# Patient Record
Sex: Female | Born: 1970
Health system: Southern US, Community
[De-identification: ages and names within clinical notes are randomized; demographics above are authoritative.]

## PROBLEM LIST (undated history)

## (undated) HISTORY — PX: WISDOM TOOTH EXTRACTION: SHX21

---

## 2004-05-15 HISTORY — PX: DILATION AND CURETTAGE OF UTERUS: SHX78

## 2008-08-05 ENCOUNTER — Ambulatory Visit: Payer: Self-pay | Admitting: Sports Medicine

## 2008-08-05 DIAGNOSIS — M25559 Pain in unspecified hip: Secondary | ICD-10-CM

## 2008-08-05 DIAGNOSIS — M7750 Other enthesopathy of unspecified foot: Secondary | ICD-10-CM

## 2008-08-05 DIAGNOSIS — M2170 Unequal limb length (acquired), unspecified site: Secondary | ICD-10-CM

## 2008-08-05 HISTORY — DX: Unequal limb length (acquired), unspecified site: M21.70

## 2008-08-05 HISTORY — DX: Other enthesopathy of unspecified foot and ankle: M77.50

## 2008-08-05 HISTORY — DX: Pain in unspecified hip: M25.559

## 2008-09-09 ENCOUNTER — Ambulatory Visit: Payer: Self-pay | Admitting: Sports Medicine

## 2012-10-03 ENCOUNTER — Ambulatory Visit (INDEPENDENT_AMBULATORY_CARE_PROVIDER_SITE_OTHER): Payer: BC Managed Care – PPO | Admitting: Sports Medicine

## 2012-10-03 ENCOUNTER — Encounter: Payer: Self-pay | Admitting: Sports Medicine

## 2012-10-03 VITALS — BP 110/73 | HR 67 | Ht 67.0 in | Wt 120.0 lb

## 2012-10-03 DIAGNOSIS — M2170 Unequal limb length (acquired), unspecified site: Secondary | ICD-10-CM

## 2012-10-03 DIAGNOSIS — M7750 Other enthesopathy of unspecified foot: Secondary | ICD-10-CM

## 2012-10-03 DIAGNOSIS — M722 Plantar fascial fibromatosis: Secondary | ICD-10-CM

## 2012-10-03 HISTORY — DX: Plantar fascial fibromatosis: M72.2

## 2012-10-03 NOTE — Progress Notes (Signed)
  Subjective:    Patient ID: Jean Miller, female    DOB: 09/10/1970, 42 y.o.   MRN: [id number]  HPI  Pt presents to clinic for evaluation of lt heel pain since 09/14/12.  Wore high heels all day at a graduation, the next day was painful to bear weight. Pain is at the insertion of plantar fascia. Took a week off of running. When started back running, felt like pain was some better. Now also having some left hip pain.  From a visit in 2010 with no that she has a shorter right leg and metatarsal arch collapse  She continues to run 10-13 miles a week and works out in [employer]     Review of Systems     Objective:   Physical Exam  NAD  Lt foot:  Good great toe motion ttp at plantar fascia insertion Bunionette on lt Morton's foot 3rd toe equal length to great toe  Long arch preserved bilat  Post tib function good bilat Kissing calluses on lateral toes - 3-5 bilat Hip abduction strong bilat Hip flexion strong bilat  Rt leg 1 cm longer than lt leg  Running gait shows that without correction she leans to the right side Otherwise form is good      Assessment & Plan:

## 2012-10-03 NOTE — Assessment & Plan Note (Signed)
Continue using metatarsal pads  Placement of these is correct

## 2012-10-03 NOTE — Assessment & Plan Note (Signed)
Since this seems to be early in the process we will use an arch strap and icing  Stretches and home exercises  Recheck if not resolving in 6 weeks

## 2012-10-03 NOTE — Assessment & Plan Note (Signed)
She had placed the correction on the wrong leg and so I think this was triggering her hip pain  We added a 5/16 heel pad on the right and a 3/16 on the left and her gait was significantly improved

## 2012-10-22 ENCOUNTER — Ambulatory Visit: Payer: Self-pay | Admitting: Sports Medicine

## 2016-05-18 ENCOUNTER — Telehealth: Payer: Self-pay | Admitting: Internal Medicine

## 2016-05-18 NOTE — Telephone Encounter (Signed)
Patient states that she was seen at [facility] for a consultation for colonoscopy but would like to come to our office because her husband is a patient of Dr. Vena Rua. Patient states that her mother was diagnosed with colon cancer at 43 and had a maternal uncle that was diagnosed with colon cancer in his 27s. Patient is requesting to see Dr. Hilarie Fredrickson and records placed on his desk for review.

## 2016-05-23 ENCOUNTER — Encounter: Payer: Self-pay | Admitting: Internal Medicine

## 2016-05-23 NOTE — Telephone Encounter (Signed)
Records reviewed and LM on Vmail to call back to schedule colon w/Dr. Hilarie Fredrickson

## 2016-07-19 ENCOUNTER — Ambulatory Visit (AMBULATORY_SURGERY_CENTER): Payer: Self-pay

## 2016-07-19 VITALS — Ht 67.0 in | Wt 120.4 lb

## 2016-07-19 DIAGNOSIS — Z800 Family history of malignant neoplasm of digestive organs: Secondary | ICD-10-CM

## 2016-07-19 MED ORDER — NA SULFATE-K SULFATE-MG SULF 17.5-3.13-1.6 GM/177ML PO SOLN: 1.0000 | Freq: Once | ORAL | 0 refills | Status: AC

## 2016-07-19 NOTE — Progress Notes (Signed)
Denies allergies to eggs or soy products. Denies complication of anesthesia or sedation. Denies use of weight loss medication. Denies use of O2.   Emmi instructions given for colonoscopy.  

## 2016-07-25 ENCOUNTER — Encounter: Payer: Self-pay | Admitting: Internal Medicine

## 2016-08-03 ENCOUNTER — Encounter: Payer: Self-pay | Admitting: Internal Medicine

## 2016-08-03 ENCOUNTER — Ambulatory Visit (AMBULATORY_SURGERY_CENTER): Payer: BLUE CROSS/BLUE SHIELD | Admitting: Internal Medicine

## 2016-08-03 VITALS — BP 92/48 | HR 66 | Temp 98.6°F | Resp 11 | Ht 67.0 in | Wt 120.0 lb

## 2016-08-03 DIAGNOSIS — Z1212 Encounter for screening for malignant neoplasm of rectum: Secondary | ICD-10-CM | POA: Diagnosis not present

## 2016-08-03 DIAGNOSIS — K635 Polyp of colon: Secondary | ICD-10-CM | POA: Diagnosis not present

## 2016-08-03 DIAGNOSIS — Z1211 Encounter for screening for malignant neoplasm of colon: Secondary | ICD-10-CM | POA: Diagnosis not present

## 2016-08-03 DIAGNOSIS — Z800 Family history of malignant neoplasm of digestive organs: Secondary | ICD-10-CM

## 2016-08-03 DIAGNOSIS — D128 Benign neoplasm of rectum: Secondary | ICD-10-CM

## 2016-08-03 DIAGNOSIS — K621 Rectal polyp: Secondary | ICD-10-CM | POA: Diagnosis not present

## 2016-08-03 MED ORDER — SODIUM CHLORIDE 0.9 % IV SOLN: 500.0000 mL | INTRAVENOUS | Status: DC

## 2016-08-03 NOTE — Progress Notes (Signed)
A and O x3. Report to RN. Tolerated MAC anesthesia well.

## 2016-08-03 NOTE — Progress Notes (Signed)
Pt's states no medical or surgical changes since previsit or office visit. 

## 2016-08-03 NOTE — Patient Instructions (Signed)
Handout given on polyps   YOU HAD AN ENDOSCOPIC PROCEDURE TODAY: Refer to the procedure report and other information in the discharge instructions given to you for any specific questions about what was found during the examination. If this information does not answer your questions, please call [place] office at [phone] to clarify.   YOU SHOULD EXPECT: Some feelings of bloating in the abdomen. Passage of more gas than usual. Walking can help get rid of the air that was put into your GI tract during the procedure and reduce the bloating. If you had a lower endoscopy (such as a colonoscopy or flexible sigmoidoscopy) you may notice spotting of blood in your stool or on the toilet paper. Some abdominal soreness may be present for a day or two, also.  DIET: Your first meal following the procedure should be a light meal and then it is ok to progress to your normal diet. A half-sandwich or bowl of soup is an example of a good first meal. Heavy or fried foods are harder to digest and may make you feel nauseous or bloated. Drink plenty of fluids but you should avoid alcoholic beverages for 24 hours. If you had a esophageal dilation, please see attached instructions for diet.    ACTIVITY: Your care partner should take you home directly after the procedure. You should plan to take it easy, moving slowly for the rest of the day. You can resume normal activity the day after the procedure however YOU SHOULD NOT DRIVE, use power tools, machinery or perform tasks that involve climbing or major physical exertion for 24 hours (because of the sedation medicines used during the test).   SYMPTOMS TO REPORT IMMEDIATELY: A gastroenterologist can be reached at any hour. Please call [phone]  for any of the following symptoms:  Following lower endoscopy (colonoscopy, flexible sigmoidoscopy) Excessive amounts of blood in the stool  Significant tenderness, worsening of abdominal pains  Swelling of the abdomen that is  new, acute  Fever of 100 or higher  s  FOLLOW UP:  If any biopsies were taken you will be contacted by phone or by letter within the next 1-3 weeks. Call [phone]  if you have not heard about the biopsies in 3 weeks.  Please also call with any specific questions about appointments or follow up tests.

## 2016-08-03 NOTE — Progress Notes (Signed)
Called to room to assist during endoscopic procedure.  Patient ID and intended procedure confirmed with present staff. Received instructions for my participation in the procedure from the performing physician.  

## 2016-08-03 NOTE — Op Note (Signed)
[facility] Patient Name: Jean Miller Procedure Date: 08/03/2016 10:53 AM MRN: [id number] Endoscopist: Jerene Bears , MD Age: 45 Referring MD:  Date of Birth: 12/18/1970 Gender: Female Account #: [id number] Procedure:                Colonoscopy Indications:              Screening in patient at increased risk: Family                            history of 1st-degree relative with colorectal                            cancer, This is the patient's first colonoscopy Medicines:                Monitored Anesthesia Care Procedure:                Pre-Anesthesia Assessment:                           - Prior to the procedure, a History and Physical                            was performed, and patient medications and                            allergies were reviewed. The patient's tolerance of                            previous anesthesia was also reviewed. The risks                            and benefits of the procedure and the sedation                            options and risks were discussed with the patient.                            All questions were answered, and informed consent                            was obtained. Prior Anticoagulants: The patient has                            taken no previous anticoagulant or antiplatelet                            agents. ASA Grade Assessment: I - A normal, healthy                            patient. After reviewing the risks and benefits,                            the patient was deemed in satisfactory condition to  undergo the procedure.                           After obtaining informed consent, the colonoscope                            was passed under direct vision. Throughout the                            procedure, the patient's blood pressure, pulse, and                            oxygen saturations were monitored continuously. The                            Model PCF-H190DL  [id number]) scope was introduced                            through the anus and advanced to the the terminal                            ileum. The colonoscopy was performed without                            difficulty. The patient tolerated the procedure                            well. The quality of the bowel preparation was                            excellent. The terminal ileum, ileocecal valve,                            appendiceal orifice, and rectum were photographed. Scope In: 10:58:50 AM Scope Out: 11:24:38 AM Scope Withdrawal Time: 0 hours 19 minutes 31 seconds  Total Procedure Duration: 0 hours 25 minutes 48 seconds  Findings:                 The digital rectal exam was normal.                           The terminal ileum appeared normal.                           A 5 mm polypoid lesion was found in the distal                            rectum and approximating the dentate line. The                            lesion was sessile. No bleeding was present.                            Multiple biopsies were obtained with cold forceps  for histology in a targeted manner to exclude                            adenoma.                           The exam was otherwise without abnormality. Complications:            No immediate complications. Estimated Blood Loss:     Estimated blood loss was minimal. Impression:               - The examined portion of the ileum was normal.                           - Benign-appearing polypoid lesion in the distal                            rectum. Biopsied.                           - The examination was otherwise normal. Recommendation:           - Patient has a contact number available for                            emergencies. The signs and symptoms of potential                            delayed complications were discussed with the                            patient. Return to normal activities tomorrow.                             Written discharge instructions were provided to the                            patient.                           - Resume previous diet.                           - Continue present medications.                           - Await pathology results.                           - Repeat colonoscopy is recommended. The                            colonoscopy date will be determined after pathology                            results from today's exam become available for  review, but not longer than 5 years. Jerene Bears, MD 08/03/2016 11:34:29 AM This report has been signed electronically.

## 2016-08-04 ENCOUNTER — Telehealth: Payer: Self-pay

## 2016-08-04 NOTE — Telephone Encounter (Signed)
  Follow up Call-  Call back number 08/03/2016  Post procedure Call Back phone  # [phone]  Permission to leave phone message Yes  Some recent data might be hidden     Patient questions:  Do you have a fever, pain , or abdominal swelling? No. Pain Score  0 *  Have you tolerated food without any problems? Yes.    Have you been able to return to your normal activities? Yes.    Do you have any questions about your discharge instructions: Diet   No. Medications  No. Follow up visit  No.  Do you have questions or concerns about your Care? No.  Actions: * If pain score is 4 or above: No action needed, pain <4.

## 2016-08-09 ENCOUNTER — Encounter: Payer: Self-pay | Admitting: Internal Medicine

## 2016-08-29 ENCOUNTER — Encounter: Payer: Self-pay | Admitting: Family Medicine

## 2016-08-29 ENCOUNTER — Ambulatory Visit (INDEPENDENT_AMBULATORY_CARE_PROVIDER_SITE_OTHER): Payer: BLUE CROSS/BLUE SHIELD | Admitting: Family Medicine

## 2016-08-29 ENCOUNTER — Ambulatory Visit (HOSPITAL_BASED_OUTPATIENT_CLINIC_OR_DEPARTMENT_OTHER)
Admission: RE | Admit: 2016-08-29 | Discharge: 2016-08-29 | Disposition: A | Discharge status: Home / Self Care | Payer: BLUE CROSS/BLUE SHIELD | Source: Ambulatory Visit | Attending: Family Medicine | Admitting: Family Medicine

## 2016-08-29 VITALS — BP 116/75 | HR 83 | Ht 67.0 in | Wt 120.0 lb

## 2016-08-29 DIAGNOSIS — M25552 Pain in left hip: Secondary | ICD-10-CM | POA: Insufficient documentation

## 2016-08-29 NOTE — Patient Instructions (Signed)
Your x-rays were reassuring. You have SI joint dysfunction and piriformis syndrome. Try to avoid painful activities when possible. Tennis ball to massage area when sitting Pick 2-3 stretches where you feel the pull in the area of pain - do 3 of these and hold for 20-30 seconds twice a day. Hip standing rotation and side raises 3 sets of 10 once a day. Given length that you have had this I would start physical therapy as well. Tylenol or aleve only if needed for pain. Would consider advanced imaging (MRI) if not improving as expected. Follow up with me in 1 month to 6 weeks.

## 2016-08-31 DIAGNOSIS — M25552 Pain in left hip: Secondary | ICD-10-CM | POA: Insufficient documentation

## 2016-08-31 HISTORY — DX: Pain in left hip: M25.552

## 2016-08-31 NOTE — Progress Notes (Signed)
PCP: Oliver Pila, MD  Subjective:   HPI: Patient is a 46 y.o. female here for low back pain.  Patient reports she's had left sided low back pain for about 3 days. She usually works out 3 times a week and runs 3 days a week. Has had back issues dating back to November but especially worse past 3 days. With prolonged standing pain worsens left hip. Pain currently 5/10, up to 7/10 and sharp at worst. No acute injury or trauma. Couldn't get comfortable by this Sunday. No radiation down leg. No numbness or tingling. Has been alternating ice and heat, elevating legs, using KT Tape. No skin changes.  No past medical history on file.  Current Outpatient Prescriptions on File Prior to Visit  Medication Sig Dispense Refill  . KRILL OIL PO Take 1 capsule by mouth daily.     . magnesium citrate solution Take 6 mLs by mouth once.    . Omega-3 Fatty Acids (FISH OIL) 1000 MG CAPS Take 1 capsule by mouth daily.    . Probiotic Product (PROBIOTIC-10 PO) Take 2 capsules by mouth daily.    . TURMERIC CURCUMIN PO Take 60 capsules by mouth daily.     Current Facility-Administered Medications on File Prior to Visit  Medication Dose Route Frequency Provider Last Rate Last Dose  . 0.9 %  sodium chloride infusion  500 mL Intravenous Continuous Jerene Bears, MD        Past Surgical History:  Procedure Laterality Date  . DILATION AND CURETTAGE OF UTERUS  2006  . WISDOM TOOTH EXTRACTION Bilateral     Allergies  Allergen Reactions  . Sulfa Antibiotics Nausea Only  . Penicillins Rash    Social History   Social History  . Marital status: Married    Spouse name: N/A  . Number of children: N/A  . Years of education: N/A   Occupational History  . Not on file.   Social History Main Topics  . Smoking status: Never Smoker  . Smokeless tobacco: Never Used  . Alcohol use Yes     Comment: wine 2 or 3 a week.  . Drug use: No  . Sexual activity: Not on file   Other Topics Concern  . Not  on file   Social History Narrative  . No narrative on file    Family History  Problem Relation Age of Onset  . Colon polyps Mother   . Colon polyps Maternal Uncle   . Esophageal cancer Maternal Grandmother   . Rectal cancer Neg Hx   . Stomach cancer Neg Hx     BP 116/75   Pulse 83   Ht 5\' 7"  (1.702 m)   Wt 120 lb (54.4 kg)   LMP 08/16/2016   BMI 18.79 kg/m   Review of Systems: See HPI above.     Objective:  Physical Exam:  Gen: NAD, comfortable in exam room  Back/left hip: No gross deformity, scoliosis. TTP left SI joint and over piriformis.  No midline or bony TTP. FROM without pain on back motions or passive hip motion. Strength LEs 5/5 all muscle groups without reproducing pain. 2+ MSRs in patellar and achilles tendons, equal bilaterally. Negative SLRs. Sensation intact to light touch bilaterally. Negative logroll bilateral hips Pain with piriformis stretch, fabers. Left leg longer than right.   Assessment & Plan:  1. Left hip pain - independently reviewed radiographs and no abnormalities.  She has not been running much past 6 months due to pain - unlikely she  has a stress fracture though discussed this as a less likely possibility.  Exam fits with piriformis syndrome and SI joint dysfunction.  Start with physical therapy, home exercises and stretches.  Tylenol or aleve.  Consider MRI if not improving.  F/u in 1 month to 6 weeks.

## 2016-08-31 NOTE — Assessment & Plan Note (Signed)
independently reviewed radiographs and no abnormalities.  She has not been running much past 6 months due to pain - unlikely she has a stress fracture though discussed this as a less likely possibility.  Exam fits with piriformis syndrome and SI joint dysfunction.  Start with physical therapy, home exercises and stretches.  Tylenol or aleve.  Consider MRI if not improving.  F/u in 1 month to 6 weeks.

## 2017-05-21 ENCOUNTER — Ambulatory Visit: Payer: BLUE CROSS/BLUE SHIELD | Admitting: Sports Medicine

## 2017-05-21 ENCOUNTER — Encounter: Payer: Self-pay | Admitting: Sports Medicine

## 2017-05-21 ENCOUNTER — Ambulatory Visit
Admission: RE | Admit: 2017-05-21 | Discharge: 2017-05-21 | Disposition: A | Discharge status: Home / Self Care | Payer: BLUE CROSS/BLUE SHIELD | Source: Ambulatory Visit | Attending: Sports Medicine | Admitting: Sports Medicine

## 2017-05-21 VITALS — BP 112/72 | Ht 67.0 in | Wt 120.0 lb

## 2017-05-21 DIAGNOSIS — M545 Low back pain: Secondary | ICD-10-CM | POA: Diagnosis not present

## 2017-05-21 DIAGNOSIS — G8929 Other chronic pain: Secondary | ICD-10-CM | POA: Diagnosis not present

## 2017-05-21 NOTE — Progress Notes (Signed)
   Subjective:    Patient ID: Jean Miller, female    DOB: [birth year], 47 y.o.   MRN: [id number]  HPI chief complaint: Low back pain and left hip pain  Very pleasant 47 year old female comes in today complaining of left-sided low back pain that has been present since November 2017. She has noticed that certain activities such as bending forward will cause discomfort that she localizes in the left buttock. She's had several episodes of spasm with radiating pain down the left leg, specifically down the left hamstring to the knee. When she first began to experience symptoms she was a runner. She has since stopped running and now does more recreational walking which does seem to help with her pain. She remains active in the gym working out. She does notice that things like squats and deadlifts will cause discomfort. She does endorse some occasional groin pain as well but the groin pain is not severe. She does get occasional numbness and tingling. Some mild weakness. Some pain at night. She was seen and evaluated by Dr. Barbaraann Barthel in April of last year. X-rays of her left hip were unremarkable. She was referred to physical therapy but only attended a single visit. No prior low back surgery. No fevers or chills.  Past medical history reviewed Medications reviewed Allergies reviewed    Review of Systems    as above Objective:   Physical Exam  Well-developed, fit appearing. No acute distress. Awake alert and oriented 3. Vital signs reviewed  Lumbar spine: Patient has good lumbar range of motion but does have pain with forward flexion. No spasm. She is tender to palpation around the left SI joint and piriformis. Negative straight leg raise bilaterally. She does have some slight weakness with resisted great toe extension on the left compared to the right but the remainder of her strength is 5/5 in both lower extremities. Reflexes are brisk and equal at the Achilles and patellar tendons  bilaterally. Sensation is intact to light touch grossly. No atrophy.  Left hip: Smooth painless hip range of motion with a negative log roll. No tenderness to palpation.  Neutral gait with walking.      Assessment & Plan:   Chronic left-sided low back pain likely secondary to bulging lumbar disc  I'm going to start with getting an x-ray of her lumbar spine. I will call her with those results when available. We will discuss further workup and treatment at that time to include possible MRI versus a return to physical therapy. In the meantime, she needs to continue to avoid exercises in the gym that will exacerbate her symptoms such as squats and deadlifts.

## 2017-05-22 ENCOUNTER — Other Ambulatory Visit: Payer: Self-pay | Admitting: *Deleted

## 2017-05-22 ENCOUNTER — Telehealth: Payer: Self-pay | Admitting: Sports Medicine

## 2017-05-22 DIAGNOSIS — M5126 Other intervertebral disc displacement, lumbar region: Secondary | ICD-10-CM

## 2017-05-22 DIAGNOSIS — M5136 Other intervertebral disc degeneration, lumbar region: Secondary | ICD-10-CM

## 2017-05-22 NOTE — Telephone Encounter (Signed)
  I spoke with Jean Miller on the phone today after reviewing the lumbar spine x-ray that she had done yesterday. Overall her spine is rather healthy. She does have some mild disc space narrowing at L5-S1. I think her symptoms are discogenic. She has decided to try physical therapy prior to getting an MRI. I think that is certainly reasonable. We will arrange for physical therapy in [facility] since that is most convenient for her. If she does not improve then we may revisit the idea of a lumbar spine MRI. She will follow-up with me as needed.

## 2019-04-01 ENCOUNTER — Telehealth: Payer: Self-pay

## 2019-04-01 NOTE — Telephone Encounter (Signed)
The patient's husband (a patient of Dr. Burt Knack) called to request a new patient appointment. Informed him Dr. Burt Knack is not accepting new patients at this time.   Spoke with patient and his wife on speakerphone. They are very proactive and "progressive" with their care and would like to have a doctor who is up to date with imaging and reads studies.  Scheduled the patient for NP visit with Dr. Meda Coffee 12/15.  Will request echo from 5 years ago to have on hand for visit. They were grateful for assistance.

## 2019-04-17 ENCOUNTER — Telehealth: Payer: Self-pay

## 2019-04-17 NOTE — Telephone Encounter (Signed)
I CALLED THE PATIENT TO GET MORE INFORMATION ON HER ECHO DONE 5 YEARS AGO. DR. Adella Nissen OFFICE STATES THAT THE PATIENT HAS NOT BEEN SEEN AT THEIR OFFICE SINCE 2017 AND THAT THEY HAD NO RECORD OF AN ECHO. I ALSO CALLED DR HIMES AND THEY STATED THAT SHE WAS NOT A PATIENT OF THEIRS.

## 2019-04-29 ENCOUNTER — Ambulatory Visit: Payer: PRIVATE HEALTH INSURANCE | Admitting: Cardiology

## 2019-04-29 ENCOUNTER — Other Ambulatory Visit: Payer: Self-pay

## 2019-04-29 ENCOUNTER — Encounter: Payer: Self-pay | Admitting: Cardiology

## 2019-04-29 VITALS — BP 122/78 | HR 85 | Ht 67.0 in | Wt 122.0 lb

## 2019-04-29 DIAGNOSIS — E785 Hyperlipidemia, unspecified: Secondary | ICD-10-CM | POA: Diagnosis not present

## 2019-04-29 DIAGNOSIS — Z8249 Family history of ischemic heart disease and other diseases of the circulatory system: Secondary | ICD-10-CM

## 2019-04-29 DIAGNOSIS — R011 Cardiac murmur, unspecified: Secondary | ICD-10-CM | POA: Diagnosis not present

## 2019-04-29 NOTE — Progress Notes (Addendum)
Cardiology Office Note:    Date:  05/01/2019   ID:  Jean Miller, DOB 30-May-1970, MRN [id number]  PCP:  Oliver Pila, MD  Cardiologist:  No primary care provider on file.  Electrophysiologist:  None   Referring MD: Oliver Pila, MD   Reason for visit: Hyperlipidemia, murmur  History of Present Illness:    Jean Miller is a 48 y.o. female with a hx of hyperlipidemia, family history of early CAD and history of a murmur.  Patient previously had an echocardiogram performed and showed mild mitral and tricuspid regurgitation otherwise normal LVEF.  She was previously diagnosed with hyperlipidemia with elevated LDL and LDL particle numbers, this was not treated with statins but she is taking fish oil.  She is otherwise very healthy, she exercises on a regular basis she is a runner runs 3 to 5 miles several times a week with no symptoms of chest pain or shortness of breath.  She denies any palpitations lower extremity edema.  Past Medical History:  Diagnosis Date  . HIP PAIN, RIGHT 08/05/2008   Qualifier: Diagnosis of  By: Oneida Alar MD, KARL    . Left hip pain 08/31/2016  . METATARSALGIA 08/05/2008   Qualifier: Diagnosis of  By: Oneida Alar MD, KARL    . Plantar fasciitis of left foot 10/03/2012   This seems to been triggered by wearing high heels that were uncomfortable   . UNEQUAL LEG LENGTH 08/05/2008   Qualifier: Diagnosis of  By: Oneida Alar MD, KARL      Past Surgical History:  Procedure Laterality Date  . DILATION AND CURETTAGE OF UTERUS  2006  . WISDOM TOOTH EXTRACTION Bilateral     Current Medications: Current Meds  Medication Sig  . Cholecalciferol (VITAMIN D) 50 MCG (2000 UT) tablet Take 2,000 Units by mouth daily.  Marland Kitchen DHA-PHOSPHOLIPIDS, PORCINE, PO Take 1 capsule by mouth daily.  Marland Kitchen ELDERBERRY PO Take 1 capsule by mouth 2 (two) times daily.  . magnesium citrate solution Take 6 mLs by mouth once.  . Menaquinone-7 (VITAMIN K2) 100 MCG CAPS Take 1 capsule by  mouth daily.  . Omega-3 Fatty Acids (FISH OIL) 1000 MG CAPS Take 1 capsule by mouth daily.  . Probiotic Product (PROBIOTIC-10 PO) Take 2 capsules by mouth daily.  . TURMERIC CURCUMIN PO Take 60 capsules by mouth daily.  . Zinc 15 MG CAPS Take 1 capsule by mouth 2 (two) times daily.   Current Facility-Administered Medications for the 04/29/19 encounter (Office Visit) with Dorothy Spark, MD  Medication  . 0.9 %  sodium chloride infusion     Allergies:   Sulfa antibiotics and Penicillins   Social History   Socioeconomic History  . Marital status: Married    Spouse name: Not on file  . Number of children: Not on file  . Years of education: Not on file  . Highest education level: Not on file  Occupational History  . Not on file  Tobacco Use  . Smoking status: Never Smoker  . Smokeless tobacco: Never Used  Substance and Sexual Activity  . Alcohol use: Yes    Comment: wine 2 or 3 a week.  . Drug use: No  . Sexual activity: Not on file  Other Topics Concern  . Not on file  Social History Narrative  . Not on file   Social Determinants of Health   Financial Resource Strain:   . Difficulty of Paying Living Expenses: Not on file  Food Insecurity:   . Worried About [employer] of  Food in the Last Year: Not on file  . Ran Out of Food in the Last Year: Not on file  Transportation Needs:   . Lack of Transportation (Medical): Not on file  . Lack of Transportation (Non-Medical): Not on file  Physical Activity:   . Days of Exercise per Week: Not on file  . Minutes of Exercise per Session: Not on file  Stress:   . Feeling of Stress : Not on file  Social Connections:   . Frequency of Communication with Friends and Family: Not on file  . Frequency of Social Gatherings with Friends and Family: Not on file  . Attends Religious Services: Not on file  . Active Member of Clubs or Organizations: Not on file  . Attends Archivist Meetings: Not on file  . Marital Status: Not  on file     Family History: The patient's family history includes Colon polyps in her maternal uncle and mother; Esophageal cancer in her maternal grandmother. There is no history of Rectal cancer or Stomach cancer.  ROS:   Please see the history of present illness.    All other systems reviewed and are negative.  EKGs/Labs/Other Studies Reviewed:    The following studies were reviewed today:  EKG:  EKG is ordered today.  The ekg ordered today demonstrates normal sinus rhythm, normal EKG, no prior available for comparison.  This was personally reviewed.  Recent Labs: 04/29/2019: ALT 8; BUN 16; Creatinine, Ser 0.65; Hemoglobin 14.2; Platelets 236; Potassium 4.1; Sodium 139; TSH 1.310  Recent Lipid Panel No results found for: CHOL, TRIG, HDL, CHOLHDL, VLDL, LDLCALC, LDLDIRECT  Physical Exam:    VS:  BP 122/78   Pulse 85   Ht 5' 7"  (1.702 m)   Wt 122 lb (55.3 kg)   SpO2 98%   BMI 19.11 kg/m     Wt Readings from Last 3 Encounters:  04/29/19 122 lb (55.3 kg)  05/21/17 120 lb (54.4 kg)  08/29/16 120 lb (54.4 kg)     GEN:  Well nourished, well developed in no acute distress HEENT: Normal NECK: No JVD; No carotid bruits LYMPHATICS: No lymphadenopathy CARDIAC: RRR, 2/6 systolic murmurs, rubs, gallops RESPIRATORY:  Clear to auscultation without rales, wheezing or rhonchi  ABDOMEN: Soft, non-tender, non-distended MUSCULOSKELETAL:  No edema; No deformity  SKIN: Warm and dry NEUROLOGIC:  Alert and oriented x 3 PSYCHIATRIC:  Normal affect   ASSESSMENT:    1. Murmur, cardiac   2. Family history of early CAD   3. Hyperlipidemia, unspecified hyperlipidemia type    PLAN:    In order of problems listed above:  1. Murmur, previously having mild mitral and tricuspid regurgitation, there was a note about calcifications possibly mitral annular calcifications, we will repeat an echocardiogram to reevaluate. 2. Hyperlipidemia -patient has elevated LDL particle number, today 1379,  HDL 83, LDL 125 and triglycerides of 80, her CRP is 0, LP-IR score is less than 2540 is normal.  She has normal liver function test.  She is awaiting calcium scoring, we will decide about lipid management based on calcium score result that might  reclassify her risk for based on Framingham risk score assessment.   Medication Adjustments/Labs and Tests Ordered: Current medicines are reviewed at length with the patient today.  Concerns regarding medicines are outlined above.  Orders Placed This Encounter  Procedures  . CT CARDIAC SCORING  . NMR, lipoprofile  . CBC with Differential  . Comp Met (CMET)  . TSH  . C-reactive  protein  . EKG 12-Lead  . ECHOCARDIOGRAM COMPLETE   No orders of the defined types were placed in this encounter.   Patient Instructions  Medication Instructions:   *If you need a refill on your cardiac medications before your next appointment, please call your pharmacy*  Lab Work: Your physician recommends that you have lab work today- NMR lipid, CMET, CRP, TSH, CBC.  If you have labs (blood work) drawn today and your tests are completely normal, you will receive your results only by: Marland Kitchen MyChart Message (if you have MyChart) OR . A paper copy in the mail If you have any lab test that is abnormal or we need to change your treatment, we will call you to review the results.  Testing/Procedures: Your physician has requested that you have an echocardiogram. Echocardiography is a painless test that uses sound waves to create images of your heart. It provides your doctor with information about the size and shape of your heart and how well your heart's chambers and valves are working. This procedure takes approximately one hour. There are no restrictions for this procedure.  Cardiac CT scanning for calcium score, (CAT scanning), is a noninvasive, special x-ray that produces cross-sectional images of the body using x-rays and a computer. CT scans help physicians diagnose and  treat medical conditions. For some CT exams, a contrast material is used to enhance visibility in the area of the body being studied. CT scans provide greater clarity and reveal more details than regular x-ray exams.  Follow-Up: At [facility], you and your health needs are our priority.  As part of our continuing mission to provide you with exceptional heart care, we have created designated Provider Care Teams.  These Care Teams include your primary Cardiologist (physician) and Advanced Practice Providers (APPs -  Physician Assistants and Nurse Practitioners) who all work together to provide you with the care you need, when you need it.  Your next appointment:   3 month(s)  The format for your next appointment:   In Person  Provider:   You may see Dr. Meda Coffee or one of the following Advanced Practice Providers on your designated Care Team:    Melina Copa, PA-C  Ermalinda Barrios, PA-C      Signed, Ena Dawley, MD  05/01/2019 12:10 PM    [place]

## 2019-04-29 NOTE — Patient Instructions (Addendum)
Medication Instructions:   *If you need a refill on your cardiac medications before your next appointment, please call your pharmacy*  Lab Work: Your physician recommends that you have lab work today- NMR lipid, CMET, CRP, TSH, CBC.  If you have labs (blood work) drawn today and your tests are completely normal, you will receive your results only by: Marland Kitchen MyChart Message (if you have MyChart) OR . A paper copy in the mail If you have any lab test that is abnormal or we need to change your treatment, we will call you to review the results.  Testing/Procedures: Your physician has requested that you have an echocardiogram. Echocardiography is a painless test that uses sound waves to create images of your heart. It provides your doctor with information about the size and shape of your heart and how well your heart's chambers and valves are working. This procedure takes approximately one hour. There are no restrictions for this procedure.  Cardiac CT scanning for calcium score, (CAT scanning), is a noninvasive, special x-ray that produces cross-sectional images of the body using x-rays and a computer. CT scans help physicians diagnose and treat medical conditions. For some CT exams, a contrast material is used to enhance visibility in the area of the body being studied. CT scans provide greater clarity and reveal more details than regular x-ray exams.  Follow-Up: At [facility], you and your health needs are our priority.  As part of our continuing mission to provide you with exceptional heart care, we have created designated Provider Care Teams.  These Care Teams include your primary Cardiologist (physician) and Advanced Practice Providers (APPs -  Physician Assistants and Nurse Practitioners) who all work together to provide you with the care you need, when you need it.  Your next appointment:   3 month(s)  The format for your next appointment:   In Person  Provider:   You may see Dr. Meda Coffee  or one of the following Advanced Practice Providers on your designated Care Team:    Melina Copa, PA-C  Ermalinda Barrios, PA-C

## 2019-04-30 LAB — COMPREHENSIVE METABOLIC PANEL
ALT: 8 IU/L (ref 0–32)
AST: 13 IU/L (ref 0–40)
Albumin/Globulin Ratio: 2 (ref 1.2–2.2)
Albumin: 4.8 g/dL (ref 3.8–4.8)
Alkaline Phosphatase: 67 IU/L (ref 39–117)
BUN/Creatinine Ratio: 25 — ABNORMAL HIGH (ref 9–23)
BUN: 16 mg/dL (ref 6–24)
Bilirubin Total: 0.4 mg/dL (ref 0.0–1.2)
CO2: 25 mmol/L (ref 20–29)
Calcium: 9.3 mg/dL (ref 8.7–10.2)
Chloride: 99 mmol/L (ref 96–106)
Creatinine, Ser: 0.65 mg/dL (ref 0.57–1.00)
GFR calc Af Amer: 122 mL/min/{1.73_m2} (ref >59)
GFR calc non Af Amer: 106 mL/min/{1.73_m2} (ref >59)
Globulin, Total: 2.4 g/dL (ref 1.5–4.5)
Glucose: 104 mg/dL — ABNORMAL HIGH (ref 65–99)
Potassium: 4.1 mmol/L (ref 3.5–5.2)
Sodium: 139 mmol/L (ref 134–144)
Total Protein: 7.2 g/dL (ref 6.0–8.5)

## 2019-04-30 LAB — NMR, LIPOPROFILE
Cholesterol, Total: 222 mg/dL — ABNORMAL HIGH (ref 100–199)
HDL Particle Number: 32.7 umol/L (ref >=30.5)
HDL-C: 83 mg/dL (ref >39)
LDL Particle Number: 1339 nmol/L — ABNORMAL HIGH (ref <1000)
LDL Size: 21.3 nm (ref >20.5)
LDL-C (NIH Calc): 125 mg/dL — ABNORMAL HIGH (ref 0–99)
LP-IR Score: <25 (ref <=45)
Small LDL Particle Number: <90 nmol/L (ref <=527)
Triglycerides: 80 mg/dL (ref 0–149)

## 2019-04-30 LAB — CBC WITH DIFFERENTIAL/PLATELET
Basophils Absolute: 0.1 10*3/uL (ref 0.0–0.2)
Basos: 1 %
EOS (ABSOLUTE): 0.1 10*3/uL (ref 0.0–0.4)
Eos: 1 %
Hematocrit: 42.8 % (ref 34.0–46.6)
Hemoglobin: 14.2 g/dL (ref 11.1–15.9)
Immature Grans (Abs): 0 10*3/uL (ref 0.0–0.1)
Immature Granulocytes: 0 %
Lymphocytes Absolute: 1.4 10*3/uL (ref 0.7–3.1)
Lymphs: 22 %
MCH: 30.9 pg (ref 26.6–33.0)
MCHC: 33.2 g/dL (ref 31.5–35.7)
MCV: 93 fL (ref 79–97)
Monocytes Absolute: 0.4 10*3/uL (ref 0.1–0.9)
Monocytes: 6 %
Neutrophils Absolute: 4.6 10*3/uL (ref 1.4–7.0)
Neutrophils: 70 %
Platelets: 236 10*3/uL (ref 150–450)
RBC: 4.59 x10E6/uL (ref 3.77–5.28)
RDW: 11.6 % — ABNORMAL LOW (ref 11.7–15.4)
WBC: 6.5 10*3/uL (ref 3.4–10.8)

## 2019-04-30 LAB — TSH: TSH: 1.31 u[IU]/mL (ref 0.450–4.500)

## 2019-04-30 LAB — C-REACTIVE PROTEIN: CRP: 0 mg/L (ref 0–10)

## 2019-05-02 ENCOUNTER — Telehealth: Payer: Self-pay | Admitting: *Deleted

## 2019-05-02 DIAGNOSIS — R600 Localized edema: Secondary | ICD-10-CM

## 2019-05-02 DIAGNOSIS — R011 Cardiac murmur, unspecified: Secondary | ICD-10-CM

## 2019-05-02 DIAGNOSIS — E7800 Pure hypercholesterolemia, unspecified: Secondary | ICD-10-CM

## 2019-05-02 DIAGNOSIS — Z8249 Family history of ischemic heart disease and other diseases of the circulatory system: Secondary | ICD-10-CM

## 2019-05-02 DIAGNOSIS — E785 Hyperlipidemia, unspecified: Secondary | ICD-10-CM

## 2019-05-02 DIAGNOSIS — Z0000 Encounter for general adult medical examination without abnormal findings: Secondary | ICD-10-CM

## 2019-05-02 DIAGNOSIS — I739 Peripheral vascular disease, unspecified: Secondary | ICD-10-CM

## 2019-05-02 NOTE — Telephone Encounter (Signed)
Pt's husband called into the office regarding questions for himself and then added his wife has questions since seeing Dr Meda Coffee.  They are concerned because the lab work she had 12/15 was not fasting and are requesting a fasting lipid panel as well as a Apolipoprotein B be drawn while she is here for her Calcium score 12/29.  Husband also states pt had discussed with Dr Meda Coffee having a "vascuscan" at the [facility] office but this was not scheduled.      Advised I will forward this information to Dr Meda Coffee for her review and any orders.

## 2019-05-03 NOTE — Telephone Encounter (Signed)
Please repeat NMR lipids, also LPa and apolipoprotein B on 12/29. Can you also please order a B/L LE Duplex (Dg claudications)  Please ask her husband when he wants to be seen, I will be adding some clinic days in January and February, we can fit him in then.  Thank you,  K

## 2019-05-05 ENCOUNTER — Telehealth: Payer: Self-pay | Admitting: Cardiology

## 2019-05-05 NOTE — Telephone Encounter (Signed)
Pt and her Husband were both calling back to question why Dr. Meda Coffee wanted to order the lower extremity arterial US, for the pt did not want that done, she wanted the same test Dr. Burt Knack ordered on her Husband, with a 3 part vascular scan to be done.  The pts states her Husband had a Vascuscreen done back in 2018, ordered by Dr. Burt Knack.  Pt would like that same screen done.   Pts Husband is established with Dr. Burt Knack and he had a 3 part vascular scan done in 2018 that consisted of carotids, ABI's, and AAA.  Husband states this was more of a screening work-up for him, for he had abnormal cholesterol and calcium score.  Husband states the pts wants the same vascular screening done for her, as she had.   Pt and Husband do not want to proceed with lower extremity arterial US, they would like for Dr. Meda Coffee to order the requested scans as mentioned above.  Pt also states she doesn't understand why she was diagnosed with claudications as her diagnosis for ordered arterial US, for she states she never complained about her legs hurting, and she replies "I think your office is confusing me with someone else's chart."  Informed the pt and Husband that I will cancel recommended lower extremity arterial US, being they are refusing this test at this time, and forward their request for Dr. Meda Coffee to order the 3 part vascuscreen done, which included AAA, ABI'S, and Carotids. Informed both parties that I will follow-up with them once further recommendations received by Dr. Meda Coffee.  Both verbalized understanding and agrees with this plan.

## 2019-05-05 NOTE — Telephone Encounter (Signed)
Spoke with the pt and informed her that Dr. Meda Coffee recommends that we can order for her to get a repeat NMR Lipids, and add on Lipoprotein A and apolipoprotein B, for her to come in and do before or after her echo scheduled for 12/29.  Also informed the pt that Dr. Meda Coffee agreed to order for her to have bilateral lower extremity arterial US done, to further evaluate her claudications. Scheduled the pt to come in for NMR, Lipo A, and lipoprotein B to be done on 12/29.  Advised her to come fasting to that lab appt. Pt states she will do the lab prior to her scheduled echo that morning.  Informed the pt that I will place the order for her lower extremity arterial US in the system, and send a message to our [facility] schedulers to call her back and arrange this appt. Pt verbalized understanding and agrees with this plan.  Off note:  The pts Husband was not seeking out an appt with Dr. Meda Coffee, he is an established Dr. Burt Knack pt, and called in with complaints about his own personal cardiac issues to be delivered to Dr. Burt Knack and his RN, as well as called in about his wife's issues, to be addressed by Dr. Meda Coffee and myself, during the time this call was placed.

## 2019-05-05 NOTE — Telephone Encounter (Signed)
Patient is calling wanting to speak with a nurse in regards to the upcoming test that needs to be scheduled. Please advise.

## 2019-05-12 NOTE — Telephone Encounter (Signed)
Called the pt and Husband back, and informed them that the order for Vascuscreen has been placed, and our Scheduling dept will be in contact with both parties to arrange this test. While endorsing this to the pt and wife, they both were requesting that Dr. Meda Coffee remove the repeat NMR W lipids and lipoprotein A that the pt is to have done tomorrow, and just order for her to have a regular lipid panel and Apolipoprotein B done, due to deductible cost.  Per Dr. Meda Coffee, she okayed for the pt to have a regular lipid panel and Apolipoprotein B done for tomorrow 12/29, when the pt comes in for her echo and Calcium Score.  Pt is aware to come fasting for this lab appt.  Both verbalized understanding and agrees with this plan. Both were gracious for all the assistance provided.

## 2019-05-12 NOTE — Telephone Encounter (Signed)
Please order Vascuscreen: it contains bilateral carotid ultrasound, bilateral ABIs and screening for AAA, thank you

## 2019-05-12 NOTE — Telephone Encounter (Signed)
RE: pt needs to be scheduled for vascuscreen per Dr. Meda Coffee Received: Today Message Contents  Jerlyn Ly, LPN   Cc: Baxter Flattery  Forward to [place] to schedule. / I was unable to reach patient.  I will be off the rest of the week. Theodora Blow

## 2019-05-12 NOTE — Telephone Encounter (Signed)
RE: pt needs to be scheduled for vascuscreen per Dr. Meda Coffee Received: Today Message Contents  Jerlyn Ly, LPN   Cc: Baxter Flattery  Forward to [place] to schedule. / I was unable to reach patient.  I will be off the rest of the week. /

## 2019-05-13 ENCOUNTER — Other Ambulatory Visit: Payer: Self-pay

## 2019-05-13 ENCOUNTER — Other Ambulatory Visit: Payer: PRIVATE HEALTH INSURANCE | Admitting: *Deleted

## 2019-05-13 ENCOUNTER — Ambulatory Visit (HOSPITAL_COMMUNITY): Payer: PRIVATE HEALTH INSURANCE | Attending: Cardiovascular Disease

## 2019-05-13 ENCOUNTER — Ambulatory Visit (INDEPENDENT_AMBULATORY_CARE_PROVIDER_SITE_OTHER)
Admission: RE | Admit: 2019-05-13 | Discharge: 2019-05-13 | Disposition: A | Discharge status: Home / Self Care | Payer: Self-pay | Source: Ambulatory Visit | Attending: Cardiology | Admitting: Cardiology

## 2019-05-13 DIAGNOSIS — I739 Peripheral vascular disease, unspecified: Secondary | ICD-10-CM

## 2019-05-13 DIAGNOSIS — E785 Hyperlipidemia, unspecified: Secondary | ICD-10-CM

## 2019-05-13 DIAGNOSIS — R011 Cardiac murmur, unspecified: Secondary | ICD-10-CM | POA: Diagnosis present

## 2019-05-13 DIAGNOSIS — Z0000 Encounter for general adult medical examination without abnormal findings: Secondary | ICD-10-CM

## 2019-05-13 DIAGNOSIS — Z8249 Family history of ischemic heart disease and other diseases of the circulatory system: Secondary | ICD-10-CM

## 2019-05-13 DIAGNOSIS — E7800 Pure hypercholesterolemia, unspecified: Secondary | ICD-10-CM

## 2019-05-14 LAB — LIPID PANEL
Chol/HDL Ratio: 2.8 ratio (ref 0.0–4.4)
Cholesterol, Total: 204 mg/dL — ABNORMAL HIGH (ref 100–199)
HDL: 74 mg/dL (ref >39)
LDL Chol Calc (NIH): 118 mg/dL — ABNORMAL HIGH (ref 0–99)
Triglycerides: 66 mg/dL (ref 0–149)
VLDL Cholesterol Cal: 12 mg/dL (ref 5–40)

## 2019-05-14 LAB — APOLIPOPROTEIN B: Apolipoprotein B: 101 mg/dL — ABNORMAL HIGH (ref <90)

## 2019-05-14 NOTE — Telephone Encounter (Signed)
Pts Vascuscreen is scheduled for 05/20/19 at 0900. Pt made aware of appt date and time by [facility] scheduler.

## 2019-05-19 ENCOUNTER — Telehealth: Payer: Self-pay | Admitting: *Deleted

## 2019-05-19 NOTE — Telephone Encounter (Signed)
We can move her appointment to 1 year from now.

## 2019-05-19 NOTE — Telephone Encounter (Signed)
I spoke with pt and reviewed recommendations from 12/29 lab results with her. She saw Dr Meda Coffee in December with 3 month follow up planned.  Patient reports she is feeling good and is asking if she still needs to be seen in March or could appointment be moved to a later date.

## 2019-05-20 ENCOUNTER — Encounter: Payer: Self-pay | Admitting: *Deleted

## 2019-05-20 ENCOUNTER — Ambulatory Visit (HOSPITAL_COMMUNITY)
Admission: RE | Admit: 2019-05-20 | Discharge: 2019-05-20 | Disposition: A | Discharge status: Home / Self Care | Payer: PRIVATE HEALTH INSURANCE | Source: Ambulatory Visit | Attending: Cardiology | Admitting: Cardiology

## 2019-05-20 ENCOUNTER — Other Ambulatory Visit: Payer: Self-pay

## 2019-05-20 DIAGNOSIS — Z0000 Encounter for general adult medical examination without abnormal findings: Secondary | ICD-10-CM

## 2019-05-20 DIAGNOSIS — R011 Cardiac murmur, unspecified: Secondary | ICD-10-CM

## 2019-05-20 DIAGNOSIS — Z8249 Family history of ischemic heart disease and other diseases of the circulatory system: Secondary | ICD-10-CM

## 2019-05-20 DIAGNOSIS — E7800 Pure hypercholesterolemia, unspecified: Secondary | ICD-10-CM

## 2019-05-20 DIAGNOSIS — E785 Hyperlipidemia, unspecified: Secondary | ICD-10-CM

## 2019-05-20 NOTE — Telephone Encounter (Signed)
Sent the pt a mychart message on her active mychart, in regards to Dr. Meda Coffee saying it is ok for her to follow-up in one year, vs in 3 months, being she feels good and all test have come back normal so far.  Informed the pt on her mychart message that we will send her a letter in 10 months prompting her to call the office to arrange her 1 year follow-up appt with Dr. Meda Coffee.  Advised the pt to call the office back or mychart back with any questions or concerns regarding message left.  One year follow-up recall was placed in Epic.

## 2019-05-20 NOTE — Telephone Encounter (Signed)
Pt read her mychart message regarding ok for one year follow-up with Dr. Meda Coffee, via her mychart account.

## 2019-08-19 ENCOUNTER — Ambulatory Visit: Payer: PRIVATE HEALTH INSURANCE | Admitting: Cardiology

## 2020-03-03 ENCOUNTER — Other Ambulatory Visit: Payer: Self-pay

## 2020-03-03 ENCOUNTER — Emergency Department
Admission: EM | Admit: 2020-03-03 | Discharge: 2020-03-03 | Disposition: A | Discharge status: Home / Self Care | Payer: PRIVATE HEALTH INSURANCE | Source: Home / Self Care

## 2020-03-03 DIAGNOSIS — R42 Dizziness and giddiness: Secondary | ICD-10-CM | POA: Diagnosis not present

## 2020-03-03 DIAGNOSIS — R110 Nausea: Secondary | ICD-10-CM

## 2020-03-03 LAB — POCT URINALYSIS DIP (MANUAL ENTRY)
Bilirubin, UA: NEGATIVE
Glucose, UA: NEGATIVE mg/dL
Ketones, POC UA: NEGATIVE mg/dL
Leukocytes, UA: NEGATIVE
Nitrite, UA: NEGATIVE
Protein Ur, POC: NEGATIVE mg/dL
Spec Grav, UA: 1.02 (ref 1.010–1.025)
Urobilinogen, UA: 0.2 E.U./dL
pH, UA: 8.5 — AB (ref 5.0–8.0)

## 2020-03-03 LAB — POCT CBC W AUTO DIFF (K'VILLE URGENT CARE)

## 2020-03-03 LAB — POCT FASTING CBG KUC MANUAL ENTRY: POCT Glucose (KUC): 118 mg/dL — AB (ref 70–99)

## 2020-03-03 MED ORDER — MECLIZINE HCL 25 MG PO TABS: 25.0000 mg | ORAL_TABLET | Freq: Three times a day (TID) | ORAL | 0 refills | Status: DC | PRN

## 2020-03-03 MED ORDER — ONDANSETRON 4 MG PO TBDP: 4.0000 mg | ORAL_TABLET | Freq: Three times a day (TID) | ORAL | 0 refills | Status: DC | PRN

## 2020-03-03 MED ORDER — ONDANSETRON 4 MG PO TBDP
4.0000 mg | ORAL_TABLET | Freq: Once | ORAL | Status: AC
  Administered 2020-03-03: 4 mg via ORAL

## 2020-03-03 NOTE — Discharge Instructions (Signed)
Treating your symptoms today. All vitals signs normal, blood work was reassuring and did not explain symptoms. Recommend rest for remainder of the day. If dizziness reoccurs or any other symptoms appear that are severe, go immediately to the emergency department . I have prescribed Meclizine for dizziness and Zofran for nausea if symptoms reoccur.

## 2020-03-03 NOTE — ED Triage Notes (Signed)
Patient presents to Urgent Care with complaints of dizziness since earlier this morning, followed by nausea. Patient reports this happened in the past when unisom made her BP drop. Has been having diarrhea as well, states the dizziness has gotten slightly better but the nausea and vomiting have worsened. Pt is not diabetic, no family history.

## 2020-03-03 NOTE — ED Provider Notes (Addendum)
[facility]    CSN: [id number] Arrival date & time: 03/03/20  1311      History   Chief Complaint Chief Complaint  Patient presents with  . Dizziness    HPI Jean Miller is a 49 y.o. female.   HPI  Patient presents with a complaint of dizziness x today. No history of diabetes, heart disease, inner ear disease, or cardiac disease. Endorses associated nausea. Symptoms wax and wain. No history of vertigo. BP stable. Drinks plenty of water. No HA or visual changes. Unaware of any precipitating events that may have caused dizziness. Denies chest pain, shortness of breath, or weakness. Past Medical History:  Diagnosis Date  . HIP PAIN, RIGHT 08/05/2008   Qualifier: Diagnosis of  By: Oneida Alar MD, KARL    . Left hip pain 08/31/2016  . METATARSALGIA 08/05/2008   Qualifier: Diagnosis of  By: Oneida Alar MD, KARL    . Plantar fasciitis of left foot 10/03/2012   This seems to been triggered by wearing high heels that were uncomfortable   . UNEQUAL LEG LENGTH 08/05/2008   Qualifier: Diagnosis of  By: Oneida Alar MD, KARL      Patient Active Problem List   Diagnosis Date Noted  . Left hip pain 08/31/2016  . Plantar fasciitis of left foot 10/03/2012  . HIP PAIN, RIGHT 08/05/2008  . METATARSALGIA 08/05/2008  . UNEQUAL LEG LENGTH 08/05/2008    Past Surgical History:  Procedure Laterality Date  . DILATION AND CURETTAGE OF UTERUS  2006  . WISDOM TOOTH EXTRACTION Bilateral     OB History   No obstetric history on file.      Home Medications    Prior to Admission medications   Medication Sig Start Date End Date Taking? Authorizing Provider  Probiotic Product (PROBIOTIC-10 PO) Take 2 capsules by mouth daily.   Yes [provider]  Cholecalciferol (VITAMIN D) 50 MCG (2000 UT) tablet Take 2,000 Units by mouth daily.    [provider]  DHA-PHOSPHOLIPIDS, PORCINE, PO Take 1 capsule by mouth daily.    [provider]  ELDERBERRY PO Take 1 capsule  by mouth 2 (two) times daily.    [provider]  magnesium citrate solution Take 6 mLs by mouth once.    [provider]  Menaquinone-7 (VITAMIN K2) 100 MCG CAPS Take 1 capsule by mouth daily.    [provider]  Omega-3 Fatty Acids (FISH OIL) 1000 MG CAPS Take 1 capsule by mouth daily.    [provider]  TURMERIC CURCUMIN PO Take 60 capsules by mouth daily.    [provider]  Zinc 15 MG CAPS Take 1 capsule by mouth 2 (two) times daily.    [provider]    Family History Family History  Problem Relation Age of Onset  . Colon polyps Mother   . Colon polyps Maternal Uncle   . Esophageal cancer Maternal Grandmother   . Heart failure Father   . Rectal cancer Neg Hx   . Stomach cancer Neg Hx     Social History Social History   Tobacco Use  . Smoking status: Never Smoker  . Smokeless tobacco: Never Used  Substance Use Topics  . Alcohol use: Yes    Alcohol/week: 1.0 standard drink    Types: 1 Standard drinks or equivalent per week    Comment: wine 2 or 3 a week.  . Drug use: No     Allergies   Sulfa antibiotics and Penicillins Review of Systems Review  of Systems Pertinent negatives listed in HPI  Physical Exam Triage Vital Signs ED Triage Vitals  Enc Vitals Group     BP 03/03/20 1329 126/86     Pulse Rate 03/03/20 1329 74     Resp 03/03/20 1329 16     Temp 03/03/20 1329 97.9 F (36.6 C)     Temp Source 03/03/20 1329 Oral     SpO2 03/03/20 1329 99 %     Weight --      Height --      Head Circumference --      Peak Flow --      Pain Score 03/03/20 1326 0     Pain Loc --      Pain Edu? --      Excl. in Arial? --    No data found.  Updated Vital Signs BP 126/86 (BP Location: Right Arm)   Pulse 74   Temp 97.9 F (36.6 C) (Oral)   Resp 16   SpO2 99%   Visual Acuity Right Eye Distance:   Left Eye Distance:   Bilateral Distance:    Right Eye Near:   Left Eye Near:    Bilateral Near:     Physical  Exam Constitutional: Patient appears well-developed and well-nourished. No distress. HENT: Normocephalic, atraumatic, External right and left ear normal. Oropharynx is clear and moist.  Eyes: Conjunctivae and EOM are normal. PERRLA, no scleral icterus. Neck: Normal ROM. Neck supple. No JVD. No tracheal deviation. No thyromegaly. CVS: RRR, S1/S2 +, no murmurs, no gallops, no carotid bruit.  Pulmonary: Effort and breath sounds normal, no stridor, rhonchi, wheezes, rales.  Abdominal: Soft. BS +, no distension, tenderness, rebound or guarding.  Neuro: Alert. Normal reflexes, muscle tone coordination. No cranial nerve deficit. Skin: Skin is warm and dry. No rash noted. Not diaphoretic. No erythema. No pallor. Psychiatric: Normal mood and affect. Behavior, judgment, thought content normal.  UC Treatments / Results  Labs (all labs ordered are listed, but only abnormal results are displayed) Labs Reviewed  POCT FASTING CBG [facility] MANUAL ENTRY - Abnormal; Notable for the following components:      Result Value   POCT Glucose ([facility]) 118 (*)    All other components within normal limits  POCT URINALYSIS DIP (MANUAL ENTRY) - Abnormal; Notable for the following components:   Blood, UA trace-intact (*)    pH, UA 8.5 (*)    All other components within normal limits  POCT CBC W AUTO DIFF ([facility])    EKG   Radiology No results found.  Procedures Procedures (including critical care time)  Medications Ordered in UC Medications  ondansetron (ZOFRAN-ODT) disintegrating tablet 4 mg (4 mg Oral Given 03/03/20 1334)    Initial Impression / Assessment and Plan / UC Course  I have reviewed the triage vital signs and the nursing notes.  Pertinent labs & imaging results that were available during my care of the patient were reviewed by me and considered in my medical decision making (see chart for details).     Glucose, UA, CBC no acute abnormalities noted that would explain symptoms. Will  trial Meclizine and Zofran for symptoms of dizziness and nausea. Strict ER precautions given. Patient verbalized understanding and agreement with plan. Final Clinical Impressions(s) / UC Diagnoses   Final diagnoses:  Dizziness  Nausea without vomiting     Discharge Instructions     Treating your symptoms today. All vitals signs normal, blood work was reassuring and did not explain symptoms.  Recommend rest for remainder of the day. If dizziness reoccurs or any other symptoms appear that are severe, go immediately to the emergency department . I have prescribed Meclizine for dizziness and Zofran for nausea if symptoms reoccur.      ED Prescriptions    Medication Sig Dispense Auth. Provider   meclizine (ANTIVERT) 25 MG tablet  (Status: Discontinued) Take 1 tablet (25 mg total) by mouth 3 (three) times daily as needed for dizziness. 30 tablet Scot Jun, FNP   ondansetron (ZOFRAN ODT) 4 MG disintegrating tablet  (Status: Discontinued) Take 1 tablet (4 mg total) by mouth every 8 (eight) hours as needed for nausea or vomiting. 20 tablet Scot Jun, FNP   meclizine (ANTIVERT) 25 MG tablet Take 1 tablet (25 mg total) by mouth 3 (three) times daily as needed for dizziness. 30 tablet Scot Jun, FNP   ondansetron (ZOFRAN ODT) 4 MG disintegrating tablet Take 1 tablet (4 mg total) by mouth every 8 (eight) hours as needed for nausea or vomiting. 20 tablet Scot Jun, FNP     PDMP not reviewed this encounter.   Scot Jun, FNP 03/07/20 1207    Scot Jun, FNP 03/07/20 [phone]

## 2020-10-15 IMAGING — CT CT HEART SCORING
2 series · 16 of 20 positions shown, 18 images · non-contrast
Comparison: None.
COMPARISON: None.

Addendum:
EXAM:
OVER-READ INTERPRETATION  CT CHEST

The following report is an over-read performed by radiologist Dr.
Bouzakri Haouat [REDACTED] on 05/13/2019. This
over-read does not include interpretation of cardiac or coronary
anatomy or pathology. The coronary calcium score interpretation by
the cardiologist is attached.
CLINICAL DATA: Risk stratification
Coronary Calcium Score
TECHNIQUE: The patient was scanned on a Siemens Force scanner. Axial
non-contrast 3 mm slices were carried out through the heart. The
data set was analyzed on a dedicated work station and scored using
the Agatson method.

[Series 2: casc 3.0 i36f 2 bestdiast 68 % · axial · 0.30mm/px · z∈[-228,-108]mm · 8 of 52 slices shown, 10 images]
[im 6/52  vessel]
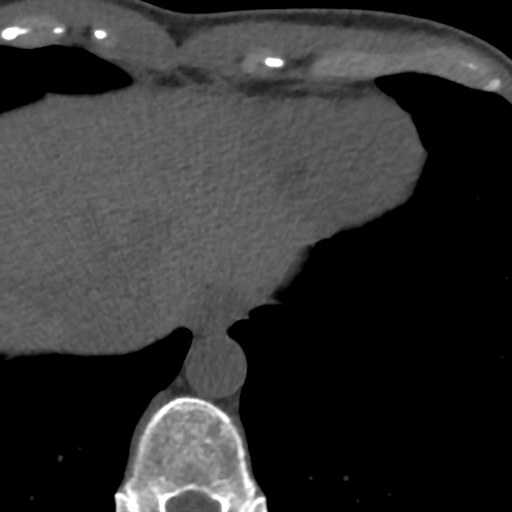
[im 6/52  lung]
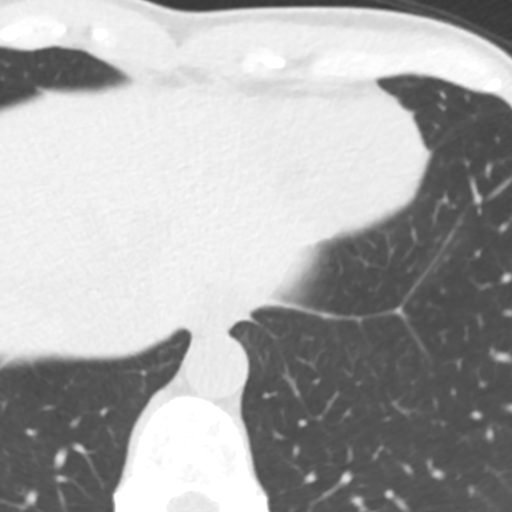
[im 12/52  vessel]
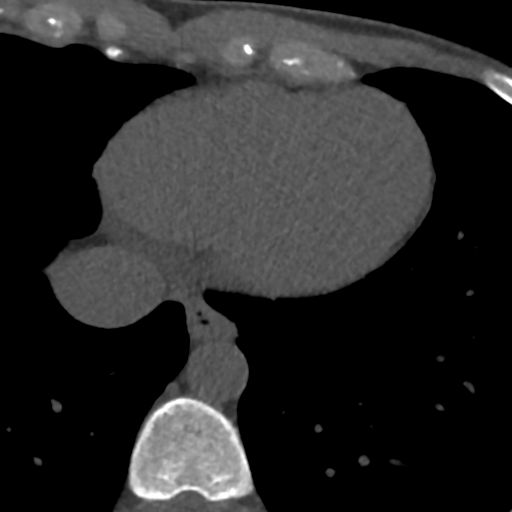
[im 18/52  vessel]
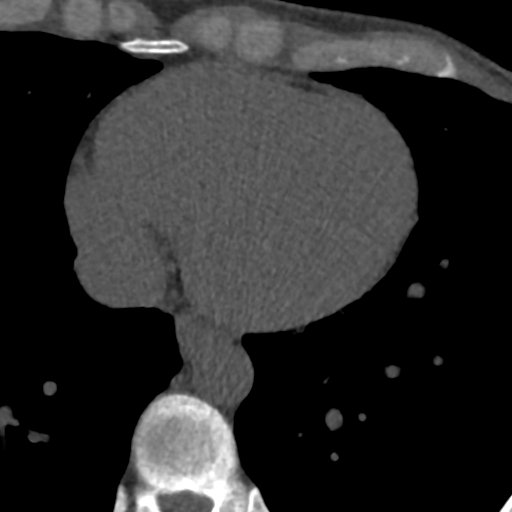
[im 23/52  vessel]
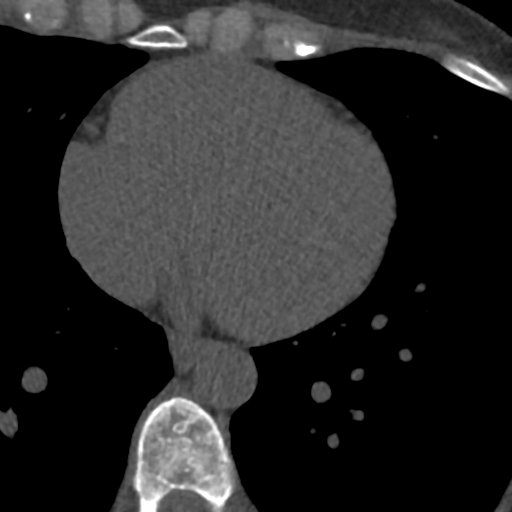
[im 29/52  vessel]
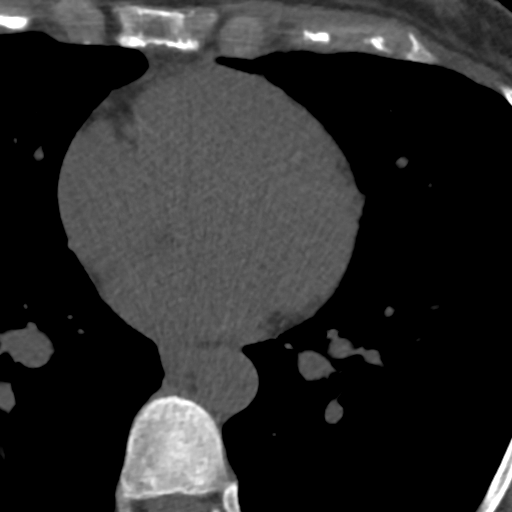
[im 29/52  lung]
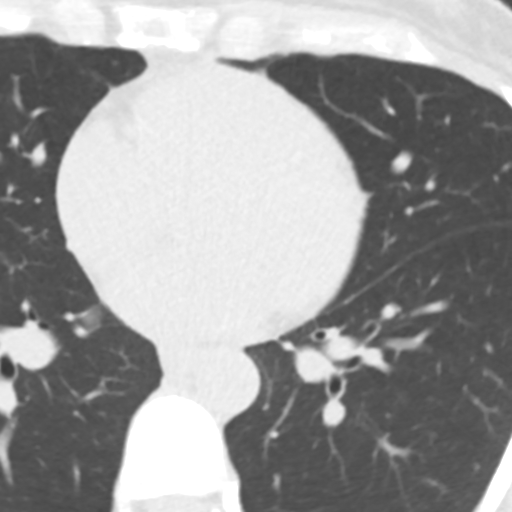
[im 35/52  vessel]
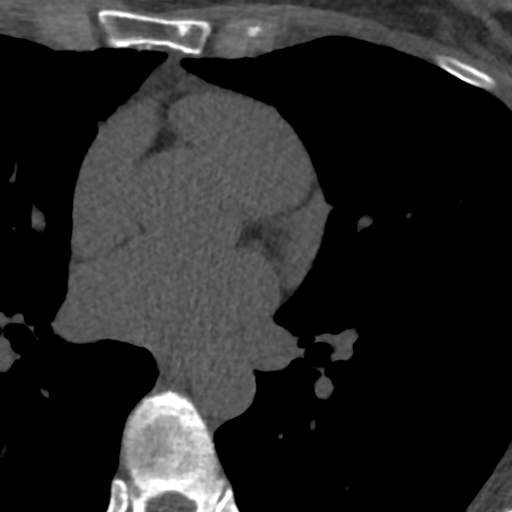
[im 40/52  vessel]
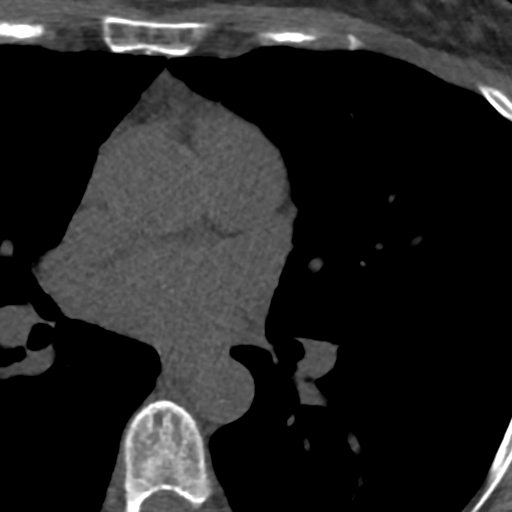
[im 46/52  vessel]
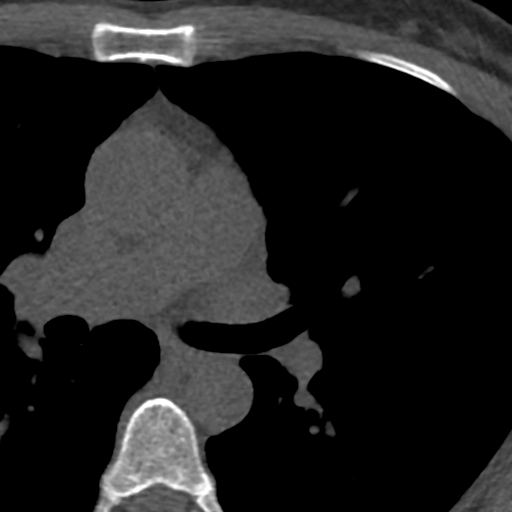

[Series 4: lung st 68 % · axial · 0.66mm/px · z∈[-228,-108]mm · 8 of 52 slices shown]
[im 6/52  lung]
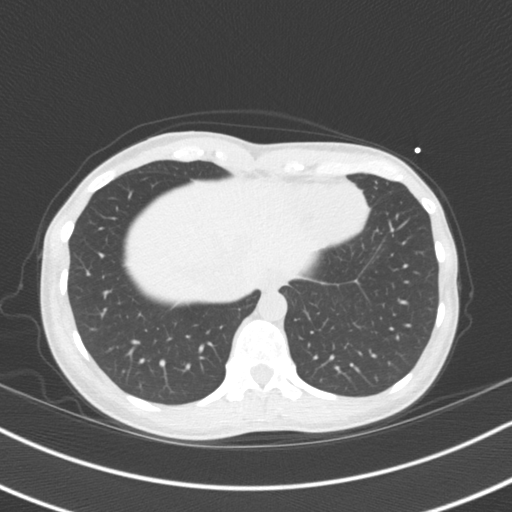
[im 12/52  lung]
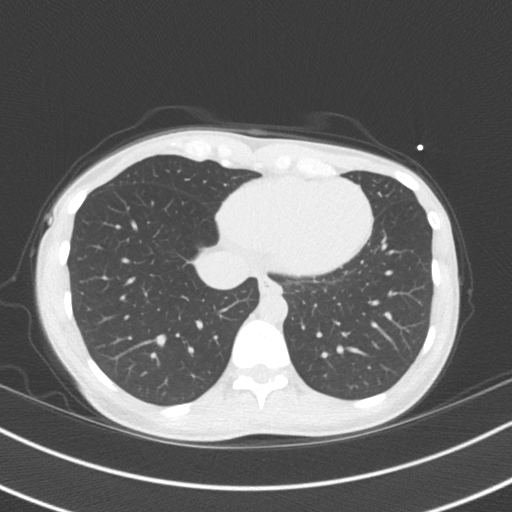
[im 18/52  lung]
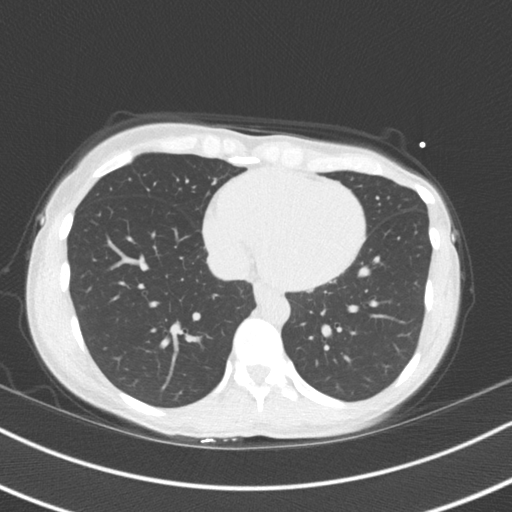
[im 23/52  lung]
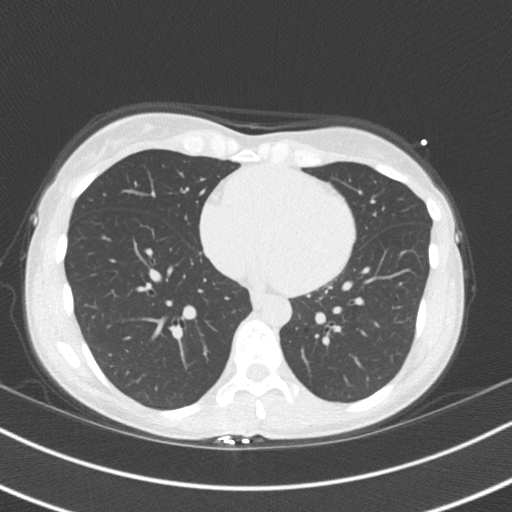
[im 29/52  lung]
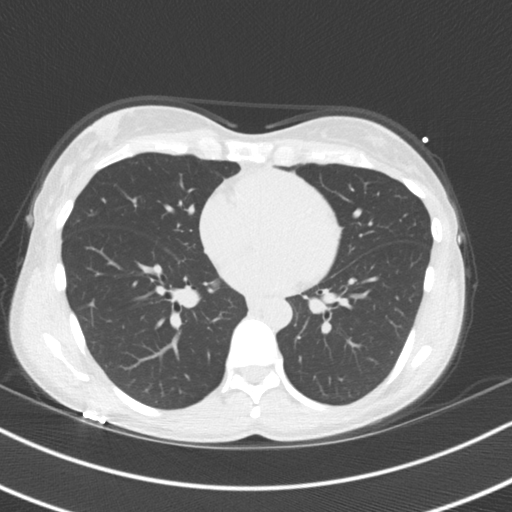
[im 35/52  lung]
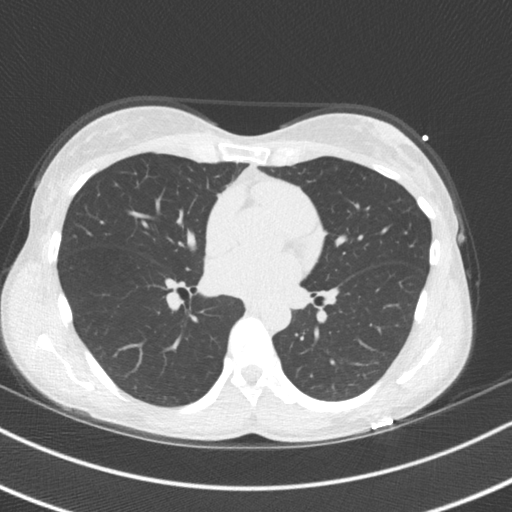
[im 40/52  lung]
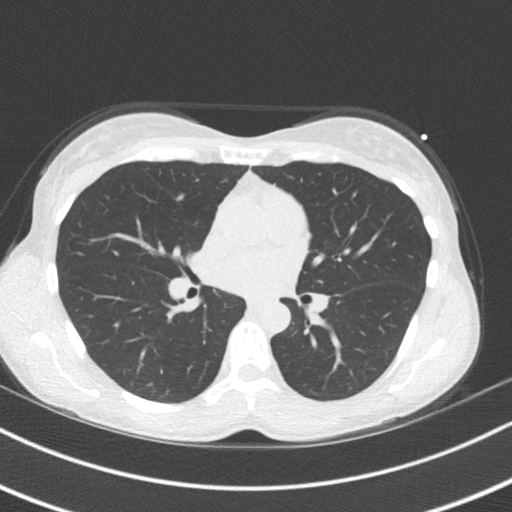
[im 46/52  lung]
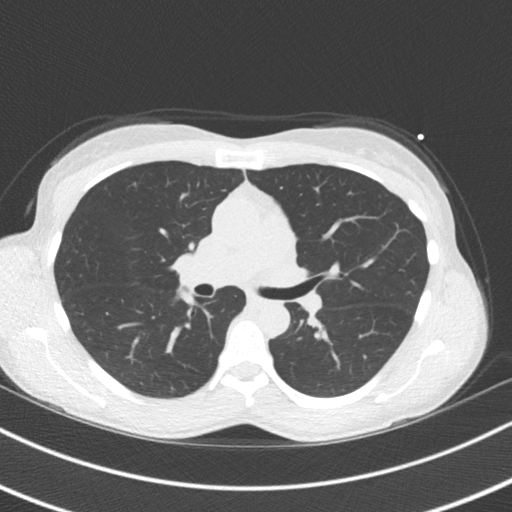

[16 of 20 positions shown; findings below may reference images not displayed]

FINDINGS: Within the visualized portions of the thorax there are no suspicious
appearing pulmonary nodules or masses, there is no acute
consolidative airspace disease, no pleural effusions, no
pneumothorax and no lymphadenopathy. Visualized portions of the
upper abdomen are unremarkable. There are no aggressive appearing
lytic or blastic lesions noted in the visualized portions of the
skeleton.
IMPRESSION: 1. No significant incidental noncardiac findings are noted.
FINDINGS: Non-cardiac: See separate report from [REDACTED].

Ascending Aorta: Normal size, no calcifications.

Pericardium: Normal.

Coronary arteries: Normal origin.
IMPRESSION: Coronary calcium score of 0. This was 0 percentile for age and sex
matched control.

*** End of Addendum ***
EXAM:
OVER-READ INTERPRETATION  CT CHEST

The following report is an over-read performed by radiologist Dr.
Bouzakri Haouat [REDACTED] on 05/13/2019. This
over-read does not include interpretation of cardiac or coronary
anatomy or pathology. The coronary calcium score interpretation by
the cardiologist is attached.
FINDINGS: Within the visualized portions of the thorax there are no suspicious
appearing pulmonary nodules or masses, there is no acute
consolidative airspace disease, no pleural effusions, no
pneumothorax and no lymphadenopathy. Visualized portions of the
upper abdomen are unremarkable. There are no aggressive appearing
lytic or blastic lesions noted in the visualized portions of the
skeleton.
IMPRESSION: 1. No significant incidental noncardiac findings are noted.

## 2021-08-09 ENCOUNTER — Encounter: Payer: Self-pay | Admitting: Internal Medicine

## 2021-08-23 ENCOUNTER — Encounter: Payer: Self-pay | Admitting: Internal Medicine

## 2021-08-24 ENCOUNTER — Encounter: Payer: Self-pay | Admitting: Gastroenterology

## 2021-08-24 ENCOUNTER — Ambulatory Visit: Payer: No Typology Code available for payment source | Admitting: Gastroenterology

## 2021-08-24 VITALS — BP 96/70 | HR 76 | Ht 65.25 in | Wt 123.5 lb

## 2021-08-24 DIAGNOSIS — Z86010 Personal history of colonic polyps: Secondary | ICD-10-CM | POA: Diagnosis not present

## 2021-08-24 DIAGNOSIS — Z800 Family history of malignant neoplasm of digestive organs: Secondary | ICD-10-CM | POA: Insufficient documentation

## 2021-08-24 MED ORDER — NA SULFATE-K SULFATE-MG SULF 17.5-3.13-1.6 GM/177ML PO SOLN: 1.0000 | Freq: Once | ORAL | 0 refills | Status: AC

## 2021-08-24 NOTE — Patient Instructions (Signed)
You have been scheduled for an endoscopy and colonoscopy. Please follow the written instructions given to you at your visit today. ?Please pick up your prep supplies at the pharmacy within the next 1-3 days. ?If you use inhalers (even only as needed), please bring them with you on the day of your procedure. ? ?If you are age 50 or younger, your body mass index should be between 19-25. Your Body mass index is 20.39 kg/m?Marland Kitchen If this is out of the aformentioned range listed, please consider follow up with your Primary Care Provider.  ?________________________________________________________ ? ?The [facility] providers would like to encourage you to use [facility] to communicate with providers for non-urgent requests or questions.  Due to long hold times on the telephone, sending your provider a message by [facility] may be a faster and more efficient way to get a response.  Please allow 48 business hours for a response.  Please remember that this is for non-urgent requests.  ?_______________________________________________________ ? ?

## 2021-08-24 NOTE — Progress Notes (Signed)
? ? ? ?08/24/2021 ?Jean Miller ?[id number] ?February 11, 1971 ? ? ?HISTORY OF PRESENT ILLNESS: This is a 52 year old female who is a patient of Dr. Vena Rua.  She has a family history of colon cancer in her mother.  Her last colonoscopy was March 2018 at which time she had a hyperplastic polyp removed.  She is here to schedule her surveillance colonoscopy, but also wanted to discuss having an EGD.  She has never had an EGD in the past.  She reports a family history of esophageal cancer in her maternal grandmother.  She has no upper GI symptoms or complaints.  Moves her bowels regularly, no rectal bleeding. ? ? ?Past Medical History:  ?Diagnosis Date  ? HIP PAIN, RIGHT 08/05/2008  ? Qualifier: Diagnosis of  By: Oneida Alar MD, KARL    ? Left hip pain 08/31/2016  ? METATARSALGIA 08/05/2008  ? Qualifier: Diagnosis of  By: Oneida Alar MD, KARL    ? Plantar fasciitis of left foot 10/03/2012  ? This seems to been triggered by wearing high heels that were uncomfortable   ? UNEQUAL LEG LENGTH 08/05/2008  ? Qualifier: Diagnosis of  By: Oneida Alar MD, KARL    ? ?Past Surgical History:  ?Procedure Laterality Date  ? DILATION AND CURETTAGE OF UTERUS  2006  ? WISDOM TOOTH EXTRACTION Bilateral   ? ? reports that she has never smoked. She has never used smokeless tobacco. She reports current alcohol use of about 1.0 standard drink per week. She reports that she does not use drugs. ?family history includes Colon polyps in her maternal uncle and mother; Esophageal cancer in her maternal grandmother; Heart failure in her father. ?Allergies  ?Allergen Reactions  ? Sulfa Antibiotics Nausea Only  ? Penicillins Rash  ? ? ?  ?Outpatient Encounter Medications as of 08/24/2021  ?Medication Sig  ? AMBULATORY NON FORMULARY MEDICATION [employer] Thercurmin HP ?Take 1 tablet by mouth daily  ? Barberry-Oreg Grape-Goldenseal (BERBERINE COMPLEX PO) Take 500 mg by mouth 2 (two) times daily.  ? Cholecalciferol (VITAMIN D3 PO) Take 1,400 Units by mouth  daily.  ? COLLAGEN PO Take 1 Dose by mouth daily.  ? GLYCINE PO Take 2 g by mouth daily.  ? MAGNESIUM CHLORIDE PO Take 459 mg by mouth daily. With calcium 238 mg  ? magnesium citrate solution Take 6 mLs by mouth once.  ? MAGNESIUM PO Take 1 tablet by mouth daily. Magnesium L-Theronate 144 mg  ? Menaquinone-7 (VITAMIN K2 PO) Take 1 tablet by mouth daily. MK-4 200 mcg  ? Menaquinone-7 (VITAMIN K2) 100 MCG CAPS Take 1 capsule by mouth daily.  ? Omega-3 Fatty Acids (FISH OIL) 1000 MG CAPS Take 1 capsule by mouth daily.  ? Probiotic Product (PROBIOTIC-10 PO) Take 2 capsules by mouth as needed.  ? TURMERIC CURCUMIN PO Take 60 capsules by mouth daily.  ? Zinc 15 MG CAPS Take 1 capsule by mouth daily. cooper  ? [DISCONTINUED] Cholecalciferol (VITAMIN D) 50 MCG (2000 UT) tablet Take 2,000 Units by mouth daily.  ? [DISCONTINUED] DHA-PHOSPHOLIPIDS, PORCINE, PO Take 1 capsule by mouth daily.  ? [DISCONTINUED] ELDERBERRY PO Take 1 capsule by mouth 2 (two) times daily.  ? [DISCONTINUED] meclizine (ANTIVERT) 25 MG tablet Take 1 tablet (25 mg total) by mouth 3 (three) times daily as needed for dizziness.  ? [DISCONTINUED] ondansetron (ZOFRAN ODT) 4 MG disintegrating tablet Take 1 tablet (4 mg total) by mouth every 8 (eight) hours as needed for nausea or vomiting.  ? ?Facility-Administered Encounter Medications as of 08/24/2021  ?  Medication  ? 0.9 %  sodium chloride infusion  ? ? ? ?REVIEW OF SYSTEMS  : All other systems reviewed and negative except where noted in the History of Present Illness. ? ? ?PHYSICAL EXAM: ?Ht 5' 5.25" (1.657 m) Comment: height measured without shoes  Wt 123 lb 8 oz (56 kg)   BMI 20.39 kg/m?  ?General: Well developed white female in no acute distress ?Head: Normocephalic and atraumatic ?Eyes:  Sclerae anicteric, conjunctiva pink. ?Ears: Normal auditory acuity ?Lungs: Clear throughout to auscultation; no W/R/R. ?Heart: Regular rate and rhythm; no M/R/G. ?Abdomen: Soft, non-distended.  BS present.   Non-tender. ?Rectal: Will be done at the time of colonoscopy. ?Musculoskeletal: Symmetrical with no gross deformities  ?Skin: No lesions on visible extremities ?Extremities: No edema  ?Neurological: Alert oriented x 4, grossly non-focal ?Psychological:  Alert and cooperative. Normal mood and affect ? ?ASSESSMENT AND PLAN: ?*Family history of colon cancer in her mother: Last colonoscopy 07/2016 at which time she had a hyperplastic polyp removed.  We will schedule for her colonoscopy with Dr. Hilarie Fredrickson. ?*Family history of esophageal cancer in her maternal grandmother: Patient has no upper GI symptoms.  She is interested in one-time EGD at the time of her colonoscopy.  We discussed this and agreed to schedule since she is having colonoscopy as well, but established that going further if all looks well then would be no need for routine screening/surveillance. ? ?**The risks, benefits, and alternatives to EGD and colonoscopy were discussed with the patient and she consents to proceed.  ? ?CC:  Oliver Pila, MD ? ?  ?

## 2021-09-02 NOTE — Progress Notes (Signed)
Addendum: Reviewed and agree with assessment and management plan. Reno Clasby M, MD  

## 2021-09-22 ENCOUNTER — Encounter: Payer: Self-pay | Admitting: Internal Medicine

## 2021-09-25 ENCOUNTER — Encounter: Payer: Self-pay | Admitting: Certified Registered Nurse Anesthetist

## 2021-09-28 ENCOUNTER — Encounter: Payer: Self-pay | Admitting: Internal Medicine

## 2021-09-28 ENCOUNTER — Ambulatory Visit (AMBULATORY_SURGERY_CENTER): Payer: No Typology Code available for payment source | Admitting: Internal Medicine

## 2021-09-28 VITALS — BP 118/64 | HR 61 | Temp 99.1°F | Resp 12 | Ht 65.0 in | Wt 123.0 lb

## 2021-09-28 DIAGNOSIS — Z86010 Personal history of colonic polyps: Secondary | ICD-10-CM | POA: Diagnosis not present

## 2021-09-28 DIAGNOSIS — Z800 Family history of malignant neoplasm of digestive organs: Secondary | ICD-10-CM | POA: Diagnosis present

## 2021-09-28 MED ORDER — SODIUM CHLORIDE 0.9 % IV SOLN: 500.0000 mL | INTRAVENOUS | Status: DC

## 2021-09-28 NOTE — Progress Notes (Signed)
Pt's states no medical or surgical changes since previsit or office visit. 

## 2021-09-28 NOTE — Progress Notes (Signed)
? ?GASTROENTEROLOGY PROCEDURE H&P NOTE  ? ?Primary Care Physician: ?Oliver Pila, MD ? ? ? ?Reason for Procedure:  Family history of esophageal and colon cancer ? ?Plan:    EGD and colonoscopy ? ?Patient is appropriate for endoscopic procedure(s) in the ambulatory ([place]) setting. ? ?The nature of the procedure, as well as the risks, benefits, and alternatives were carefully and thoroughly reviewed with the patient. Ample time for discussion and questions allowed. The patient understood, was satisfied, and agreed to proceed.  ? ? ? ?HPI: ?Jean Miller is a 51 y.o. female who presents for EGD and colonoscopy.  Medical history as below.  Tolerated the prep.  No recent chest pain or shortness of breath.  No abdominal pain today. ? ?Past Medical History:  ?Diagnosis Date  ? HIP PAIN, RIGHT 08/05/2008  ? Qualifier: Diagnosis of  By: Oneida Alar MD, KARL    ? Left hip pain 08/31/2016  ? METATARSALGIA 08/05/2008  ? Qualifier: Diagnosis of  By: Oneida Alar MD, KARL    ? Plantar fasciitis of left foot 10/03/2012  ? This seems to been triggered by wearing high heels that were uncomfortable   ? UNEQUAL LEG LENGTH 08/05/2008  ? Qualifier: Diagnosis of  By: Oneida Alar MD, KARL    ? ? ?Past Surgical History:  ?Procedure Laterality Date  ? DILATION AND CURETTAGE OF UTERUS  2006  ? WISDOM TOOTH EXTRACTION Bilateral   ? ? ?Prior to Admission medications   ?Medication Sig Start Date End Date Taking? Authorizing Provider  ?AMBULATORY NON FORMULARY MEDICATION [employer] Thercurmin HP ?Take 1 tablet by mouth daily   Yes [provider]  ?Cholecalciferol (VITAMIN D3 PO) Take 1,400 Units by mouth daily.   Yes [provider]  ?COLLAGEN PO Take 1 Dose by mouth daily.   Yes [provider]  ?GLYCINE PO Take 2 g by mouth daily.   Yes [provider]  ?MAGNESIUM CHLORIDE PO Take 459 mg by mouth daily. With calcium 238 mg   Yes [provider]  ?magnesium citrate solution Take 6 mLs by  mouth once.   Yes [provider]  ?MAGNESIUM PO Take 1 tablet by mouth daily. Magnesium L-Theronate 144 mg   Yes [provider]  ?Menaquinone-7 (VITAMIN K2 PO) Take 1 tablet by mouth daily. MK-4 200 mcg   Yes [provider]  ?Menaquinone-7 (VITAMIN K2) 100 MCG CAPS Take 1 capsule by mouth daily.   Yes [provider]  ?Omega-3 Fatty Acids (FISH OIL) 1000 MG CAPS Take 1 capsule by mouth daily.   Yes [provider]  ?TURMERIC CURCUMIN PO Take 60 capsules by mouth daily.   Yes [provider]  ?Zinc 15 MG CAPS Take 1 capsule by mouth daily. cooper   Yes [provider]  ?Probiotic Product (PROBIOTIC-10 PO) Take 2 capsules by mouth as needed.    [provider]  ? ? ?Current Outpatient Medications  ?Medication Sig Dispense Refill  ? AMBULATORY NON FORMULARY MEDICATION [employer] Thercurmin HP ?Take 1 tablet by mouth daily    ? Cholecalciferol (VITAMIN D3 PO) Take 1,400 Units by mouth daily.    ? COLLAGEN PO Take 1 Dose by mouth daily.    ? GLYCINE PO Take 2 g by mouth daily.    ? MAGNESIUM CHLORIDE PO Take 459 mg by mouth daily. With calcium 238 mg    ? magnesium citrate solution Take 6 mLs by mouth once.    ? MAGNESIUM PO Take 1 tablet by mouth  daily. Magnesium L-Theronate 144 mg    ? Menaquinone-7 (VITAMIN K2 PO) Take 1 tablet by mouth daily. MK-4 200 mcg    ? Menaquinone-7 (VITAMIN K2) 100 MCG CAPS Take 1 capsule by mouth daily.    ? Omega-3 Fatty Acids (FISH OIL) 1000 MG CAPS Take 1 capsule by mouth daily.    ? TURMERIC CURCUMIN PO Take 60 capsules by mouth daily.    ? Zinc 15 MG CAPS Take 1 capsule by mouth daily. cooper    ? Probiotic Product (PROBIOTIC-10 PO) Take 2 capsules by mouth as needed.    ? ?Current Facility-Administered Medications  ?Medication Dose Route Frequency Provider Last Rate Last Admin  ? 0.9 %  sodium chloride infusion  500 mL Intravenous Continuous Emon Lance, Lajuan Lines, MD      ? 0.9 %  sodium chloride infusion   500 mL Intravenous Continuous Whittany Parish, Lajuan Lines, MD      ? ? ?Allergies as of 09/28/2021 - Review Complete 09/28/2021  ?Allergen Reaction Noted  ? Sulfa antibiotics Nausea Only 10/03/2012  ? Penicillins Rash 10/03/2012  ? ? ?Family History  ?Problem Relation Age of Onset  ? Colon polyps Mother   ? Colon cancer Mother   ? Heart disease Father   ? Esophageal cancer Maternal Grandmother   ? Bladder Cancer Paternal Grandmother   ? Heart disease Paternal Grandfather   ? Colon polyps Maternal Uncle   ? Rectal cancer Neg Hx   ? Stomach cancer Neg Hx   ? ? ?Social History  ? ?Socioeconomic History  ? Marital status: Married  ?  Spouse name: Not on file  ? Number of children: 2  ? Years of education: Not on file  ? Highest education level: Not on file  ?Occupational History  ? Not on file  ?Tobacco Use  ? Smoking status: Never  ? Smokeless tobacco: Never  ?Vaping Use  ? Vaping Use: Never used  ?Substance and Sexual Activity  ? Alcohol use: Yes  ?  Alcohol/week: 1.0 standard drink  ?  Types: 1 Standard drinks or equivalent per week  ?  Comment: wine 2 or 3 a week.  ? Drug use: No  ? Sexual activity: Not on file  ?Other Topics Concern  ? Not on file  ?Social History Narrative  ? Not on file  ? ?Social Determinants of Health  ? ?Financial Resource Strain: Not on file  ?Food Insecurity: Not on file  ?Transportation Needs: Not on file  ?Physical Activity: Not on file  ?Stress: Not on file  ?Social Connections: Not on file  ?Intimate Partner Violence: Not on file  ? ? ?Physical Exam: ?Vital signs in last 24 hours: ?'@BP'$  125/79   Pulse 81   Temp 99.1 ?F (37.3 ?C) (Temporal)   Resp 16   Ht '5\' 5"'$  (1.651 m)   Wt 123 lb (55.8 kg)   LMP 09/09/2021   SpO2 100%   BMI 20.47 kg/m?  ?GEN: NAD ?EYE: Sclerae anicteric ?ENT: MMM ?CV: Non-tachycardic ?Pulm: CTA b/l ?GI: Soft, NT/ND ?NEURO:  Alert & Oriented x 3 ? ? ?Zenovia Jarred, MD ?[facility] ? ?09/28/2021 8:44 AM ? ?

## 2021-09-28 NOTE — Op Note (Signed)
[place] ?Patient Name: Jean Miller ?Procedure Date: 09/28/2021 8:41 AM ?MRN: [id number] ?Endoscopist: Jerene Bears , MD ?Age: 51 ?Referring MD:  ?Date of Birth: [birth year] ?Gender: Female ?Account #: [id number] ?Procedure:                Colonoscopy ?Indications:              Last colonoscopy: March 2018, Family history of  ?                          colon cancer in a first-degree relative (mother) ?Medicines:                Monitored Anesthesia Care ?Procedure:                Pre-Anesthesia Assessment: ?                          - Prior to the procedure, a History and Physical  ?                          was performed, and patient medications and  ?                          allergies were reviewed. The patient's tolerance of  ?                          previous anesthesia was also reviewed. The risks  ?                          and benefits of the procedure and the sedation  ?                          options and risks were discussed with the patient.  ?                          All questions were answered, and informed consent  ?                          was obtained. Prior Anticoagulants: The patient has  ?                          taken no previous anticoagulant or antiplatelet  ?                          agents. ASA Grade Assessment: I - A normal, healthy  ?                          patient. After reviewing the risks and benefits,  ?                          the patient was deemed in satisfactory condition to  ?                          undergo the procedure. ?  After obtaining informed consent, the colonoscope  ?                          was passed under direct vision. Throughout the  ?                          procedure, the patient's blood pressure, pulse, and  ?                          oxygen saturations were monitored continuously. The  ?                          PCF-HQ190L Colonoscope was introduced through the  ?                          anus and advanced  to the terminal ileum. The  ?                          colonoscopy was performed without difficulty. The  ?                          patient tolerated the procedure well. The quality  ?                          of the bowel preparation was excellent. The  ?                          terminal ileum, ileocecal valve, appendiceal  ?                          orifice, and rectum were photographed. ?Scope In: 9:07:02 AM ?Scope Out: 9:26:52 AM ?Scope Withdrawal Time: 0 hours 12 minutes 37 seconds  ?Total Procedure Duration: 0 hours 19 minutes 50 seconds  ?Findings:                 The digital rectal exam was normal. ?                          The terminal ileum appeared normal. ?                          The colon (entire examined portion) appeared normal. ?                          Anal papillae were hypertrophied and there is an  ?                          area of prolapse change in distal rectum  ?                          approximating the dentate line. ?Complications:            No immediate complications. ?Estimated Blood Loss:     Estimated blood loss: none. ?Impression:               - The examined portion  of the ileum was normal. ?                          - The entire examined colon is normal. ?                          - Anal papillae were hypertrophied with minor  ?                          prolapse erythema in distal rectum approximating  ?                          the dentate line. ?                          - No specimens collected. ?Recommendation:           - Patient has a contact number available for  ?                          emergencies. The signs and symptoms of potential  ?                          delayed complications were discussed with the  ?                          patient. Return to normal activities tomorrow.  ?                          Written discharge instructions were provided to the  ?                          patient. ?                          - Resume previous diet. ?                           - Continue present medications. ?                          - Intermittent rectal pressure (noticed most while  ?                          driving) felt most likely to be related to prolapse. ?                          - Repeat colonoscopy in 5 years for screening  ?                          purposes. ?Jerene Bears, MD ?09/28/2021 9:39:17 AM ?This report has been signed electronically. ?

## 2021-09-28 NOTE — Progress Notes (Signed)
Report given to PACU, vss 

## 2021-09-28 NOTE — Op Note (Signed)
[place] ?Patient Name: Jean Miller ?Procedure Date: 09/28/2021 8:41 AM ?MRN: [id number] ?Endoscopist: Jerene Bears , MD ?Age: 51 ?Referring MD:  ?Date of Birth: 12-12-1970 ?Gender: Female ?Account #: [id number] ?Procedure:                Upper GI endoscopy ?Indications:              Family history of esophageal cancer ?Medicines:                Monitored Anesthesia Care ?Procedure:                Pre-Anesthesia Assessment: ?                          - Prior to the procedure, a History and Physical  ?                          was performed, and patient medications and  ?                          allergies were reviewed. The patient's tolerance of  ?                          previous anesthesia was also reviewed. The risks  ?                          and benefits of the procedure and the sedation  ?                          options and risks were discussed with the patient.  ?                          All questions were answered, and informed consent  ?                          was obtained. Prior Anticoagulants: The patient has  ?                          taken no previous anticoagulant or antiplatelet  ?                          agents. ASA Grade Assessment: I - A normal, healthy  ?                          patient. After reviewing the risks and benefits,  ?                          the patient was deemed in satisfactory condition to  ?                          undergo the procedure. ?                          After obtaining informed consent, the endoscope was  ?  passed under direct vision. Throughout the  ?                          procedure, the patient's blood pressure, pulse, and  ?                          oxygen saturations were monitored continuously. The  ?                          GIF HQ190 #[id number] was introduced through the  ?                          mouth, and advanced to the second part of duodenum.  ?                          The upper GI endoscopy  was accomplished without  ?                          difficulty. The patient tolerated the procedure  ?                          well. ?Scope In: ?Scope Out: ?Findings:                 The esophagus was normal. ?                          The stomach was normal. ?                          The examined duodenum was normal. ?Complications:            No immediate complications. ?Estimated Blood Loss:     Estimated blood loss: none. ?Impression:               - Normal esophagus. ?                          - Normal stomach. ?                          - Normal examined duodenum. ?                          - No specimens collected. ?Recommendation:           - Patient has a contact number available for  ?                          emergencies. The signs and symptoms of potential  ?                          delayed complications were discussed with the  ?                          patient. Return to normal activities tomorrow.  ?  Written discharge instructions were provided to the  ?                          patient. ?                          - Resume previous diet. ?                          - Continue present medications. ?Jerene Bears, MD ?09/28/2021 9:33:00 AM ?This report has been signed electronically. ?

## 2021-09-28 NOTE — Patient Instructions (Signed)
YOU HAD AN ENDOSCOPIC PROCEDURE TODAY AT THE [facility]:   Refer to the procedure report that was given to you for any specific questions about what was found during the examination.  If the procedure report does not answer your questions, please call your gastroenterologist to clarify.  If you requested that your care partner not be given the details of your procedure findings, then the procedure report has been included in a sealed envelope for you to review at your convenience later.  YOU SHOULD EXPECT: Some feelings of bloating in the abdomen. Passage of more gas than usual.  Walking can help get rid of the air that was put into your GI tract during the procedure and reduce the bloating. If you had a lower endoscopy (such as a colonoscopy or flexible sigmoidoscopy) you may notice spotting of blood in your stool or on the toilet paper. If you underwent a bowel prep for your procedure, you may not have a normal bowel movement for a few days.  Please Note:  You might notice some irritation and congestion in your nose or some drainage.  This is from the oxygen used during your procedure.  There is no need for concern and it should clear up in a day or so.  SYMPTOMS TO REPORT IMMEDIATELY:   Following lower endoscopy (colonoscopy or flexible sigmoidoscopy):  Excessive amounts of blood in the stool  Significant tenderness or worsening of abdominal pains  Swelling of the abdomen that is new, acute  Fever of 100F or higher   Following upper endoscopy (EGD)  Vomiting of blood or coffee ground material  New chest pain or pain under the shoulder blades  Painful or persistently difficult swallowing  New shortness of breath  Fever of 100F or higher  Black, tarry-looking stools  For urgent or emergent issues, a gastroenterologist can be reached at any hour by calling [phone]. Do not use MyChart messaging for urgent concerns.    DIET:  We do recommend a small meal at first, but  then you may proceed to your regular diet.  Drink plenty of fluids but you should avoid alcoholic beverages for 24 hours.  ACTIVITY:  You should plan to take it easy for the rest of today and you should NOT DRIVE or use heavy machinery until tomorrow (because of the sedation medicines used during the test).    FOLLOW UP: Our staff will call the number listed on your records 48-72 hours following your procedure to check on you and address any questions or concerns that you may have regarding the information given to you following your procedure. If we do not reach you, we will leave a message.  We will attempt to reach you two times.  During this call, we will ask if you have developed any symptoms of COVID 19. If you develop any symptoms (ie: fever, flu-like symptoms, shortness of breath, cough etc.) before then, please call [phone].  If you test positive for Covid 19 in the 2 weeks post procedure, please call and report this information to us.    If any biopsies were taken you will be contacted by phone or by letter within the next 1-3 weeks.  Please call us at [phone] if you have not heard about the biopsies in 3 weeks.    SIGNATURES/CONFIDENTIALITY: You and/or your care partner have signed paperwork which will be entered into your electronic medical record.  These signatures attest to the fact that that the information above on   your After Visit Summary has been reviewed and is understood.  Full responsibility of the confidentiality of this discharge information lies with you and/or your care-partner. 

## 2021-09-28 NOTE — Progress Notes (Signed)
0842 Robinul 0.1 mg IV given due large amount of secretions upon assessment.  MD made aware, vss 

## 2021-09-29 ENCOUNTER — Telehealth: Payer: Self-pay

## 2021-09-29 NOTE — Telephone Encounter (Signed)
  Follow up Call-     09/28/2021    7:55 AM  Call back number  Post procedure Call Back phone  # [phone]  Permission to leave phone message Yes     Patient questions:  Do you have a fever, pain , or abdominal swelling? No.Patient says she had a low grade fever at 99.7. I asked her to let us know if it got above 100.4 and she agreed to do so.  Pain Score  0 *  Have you tolerated food without any problems? Yes.    Have you been able to return to your normal activities? Yes.    Do you have any questions about your discharge instructions: Diet   No. Medications  No. Follow up visit  No.  Do you have questions or concerns about your Care? No.  Actions: * If pain score is 4 or above: No action needed, pain <4.
